# Patient Record
Sex: Male | Born: 1990 | Race: White | Hispanic: No | Marital: Single | Smoking: Current every day smoker
Health system: Southern US, Community
[De-identification: ages and names within clinical notes are randomized; demographics above are authoritative.]

## PROBLEM LIST (undated history)

## (undated) HISTORY — PX: CHOLECYSTECTOMY: SHX55

---
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
## 2012-06-26 ENCOUNTER — Emergency Department: Payer: Self-pay | Admitting: Internal Medicine

## 2012-06-26 LAB — CBC
HCT: 54.8 % — ABNORMAL HIGH (ref 40.0–52.0)
MCH: 30.1 pg (ref 26.0–34.0)
MCHC: 33.1 g/dL (ref 32.0–36.0)
MCV: 91 fL (ref 80–100)
Platelet: 289 10*3/uL (ref 150–440)
RDW: 13.3 % (ref 11.5–14.5)
WBC: 10.3 10*3/uL (ref 3.8–10.6)

## 2012-06-26 LAB — COMPREHENSIVE METABOLIC PANEL
Alkaline Phosphatase: 102 U/L (ref 50–136)
Anion Gap: 2 — ABNORMAL LOW (ref 7–16)
BUN: 9 mg/dL (ref 7–18)
Bilirubin,Total: 0.7 mg/dL (ref 0.2–1.0)
Calcium, Total: 9 mg/dL (ref 8.5–10.1)
Chloride: 106 mmol/L (ref 98–107)
Creatinine: 1.15 mg/dL (ref 0.60–1.30)
Glucose: 90 mg/dL (ref 65–99)
Osmolality: 278 (ref 275–301)
Potassium: 3.7 mmol/L (ref 3.5–5.1)
Sodium: 140 mmol/L (ref 136–145)
Total Protein: 7.9 g/dL (ref 6.4–8.2)

## 2012-09-18 ENCOUNTER — Emergency Department: Payer: Self-pay | Admitting: Emergency Medicine

## 2012-10-23 ENCOUNTER — Emergency Department: Payer: Self-pay | Admitting: Emergency Medicine

## 2012-10-23 LAB — CBC WITH DIFFERENTIAL/PLATELET
Basophil %: 1.1 %
Eosinophil #: 0.7 10*3/uL (ref 0.0–0.7)
Eosinophil %: 6.7 %
HCT: 51.1 % (ref 40.0–52.0)
Lymphocyte %: 25.5 %
MCH: 30.9 pg (ref 26.0–34.0)
MCHC: 34.4 g/dL (ref 32.0–36.0)
Monocyte #: 0.7 x10 3/mm (ref 0.2–1.0)
Monocyte %: 6.8 %
Neutrophil %: 59.9 %
Platelet: 249 10*3/uL (ref 150–440)
RBC: 5.68 10*6/uL (ref 4.40–5.90)
WBC: 10.6 10*3/uL (ref 3.8–10.6)

## 2012-10-23 LAB — COMPREHENSIVE METABOLIC PANEL
Albumin: 3.8 g/dL (ref 3.4–5.0)
Alkaline Phosphatase: 117 U/L (ref 50–136)
BUN: 14 mg/dL (ref 7–18)
Bilirubin,Total: 0.5 mg/dL (ref 0.2–1.0)
Chloride: 109 mmol/L — ABNORMAL HIGH (ref 98–107)
Co2: 31 mmol/L (ref 21–32)
EGFR (African American): >60
EGFR (Non-African Amer.): >60
Glucose: 91 mg/dL (ref 65–99)
Osmolality: 287 (ref 275–301)
SGPT (ALT): 96 U/L — ABNORMAL HIGH (ref 12–78)
Sodium: 144 mmol/L (ref 136–145)
Total Protein: 7.7 g/dL (ref 6.4–8.2)

## 2012-10-23 LAB — LIPASE, BLOOD: Lipase: 137 U/L (ref 73–393)

## 2012-11-17 ENCOUNTER — Ambulatory Visit: Payer: Self-pay | Admitting: Surgery

## 2012-11-18 ENCOUNTER — Ambulatory Visit: Payer: Self-pay | Admitting: Surgery

## 2013-04-03 ENCOUNTER — Emergency Department: Payer: Self-pay

## 2013-04-03 LAB — CBC
HCT: 49.6 % (ref 40.0–52.0)
HGB: 17.2 g/dL (ref 13.0–18.0)
MCHC: 34.6 g/dL (ref 32.0–36.0)
Platelet: 252 10*3/uL (ref 150–440)
RBC: 5.56 10*6/uL (ref 4.40–5.90)
RDW: 12.6 % (ref 11.5–14.5)
WBC: 10.9 10*3/uL — ABNORMAL HIGH (ref 3.8–10.6)

## 2013-04-03 LAB — BASIC METABOLIC PANEL
BUN: 9 mg/dL (ref 7–18)
Calcium, Total: 9 mg/dL (ref 8.5–10.1)
Creatinine: 0.99 mg/dL (ref 0.60–1.30)
EGFR (African American): >60
EGFR (Non-African Amer.): >60
Glucose: 86 mg/dL (ref 65–99)

## 2013-04-03 LAB — TROPONIN I: Troponin-I: <0.02 ng/mL

## 2013-04-05 ENCOUNTER — Emergency Department: Payer: Self-pay | Admitting: Emergency Medicine

## 2013-04-05 LAB — COMPREHENSIVE METABOLIC PANEL
Albumin: 3.6 g/dL (ref 3.4–5.0)
Alkaline Phosphatase: 82 U/L
Anion Gap: 4 — ABNORMAL LOW (ref 7–16)
Bilirubin,Total: 0.4 mg/dL (ref 0.2–1.0)
Calcium, Total: 9.1 mg/dL (ref 8.5–10.1)
Co2: 30 mmol/L (ref 21–32)
Creatinine: 1.07 mg/dL (ref 0.60–1.30)
EGFR (Non-African Amer.): >60
Glucose: 97 mg/dL (ref 65–99)
Osmolality: 279 (ref 275–301)
Potassium: 4.2 mmol/L (ref 3.5–5.1)
Total Protein: 7 g/dL (ref 6.4–8.2)

## 2013-04-05 LAB — CBC
HCT: 49.7 % (ref 40.0–52.0)
HGB: 17.1 g/dL (ref 13.0–18.0)
MCHC: 34.5 g/dL (ref 32.0–36.0)
MCV: 90 fL (ref 80–100)
RBC: 5.5 10*6/uL (ref 4.40–5.90)
RDW: 12.7 % (ref 11.5–14.5)
WBC: 10.4 10*3/uL (ref 3.8–10.6)

## 2013-04-05 LAB — URINALYSIS, COMPLETE
Bacteria: NONE SEEN
Bilirubin,UR: NEGATIVE
Glucose,UR: NEGATIVE mg/dL (ref 0–75)
Leukocyte Esterase: NEGATIVE
Nitrite: NEGATIVE
Ph: 7 (ref 4.5–8.0)
Protein: NEGATIVE
RBC,UR: 1 /HPF (ref 0–5)
Specific Gravity: 1.023 (ref 1.003–1.030)
WBC UR: <1 /HPF (ref 0–5)

## 2013-04-20 ENCOUNTER — Emergency Department: Payer: Self-pay | Admitting: Emergency Medicine

## 2013-04-20 LAB — BASIC METABOLIC PANEL
Anion Gap: 4 — ABNORMAL LOW (ref 7–16)
BUN: 9 mg/dL (ref 7–18)
Calcium, Total: 9 mg/dL (ref 8.5–10.1)
Chloride: 107 mmol/L (ref 98–107)
Creatinine: 0.93 mg/dL (ref 0.60–1.30)
EGFR (African American): >60
EGFR (Non-African Amer.): >60
Glucose: 95 mg/dL (ref 65–99)
Osmolality: 274 (ref 275–301)
Potassium: 3.7 mmol/L (ref 3.5–5.1)
Sodium: 138 mmol/L (ref 136–145)

## 2013-04-20 LAB — URINALYSIS, COMPLETE
Bacteria: NONE SEEN
Blood: NEGATIVE
Glucose,UR: NEGATIVE mg/dL (ref 0–75)
Nitrite: NEGATIVE
Ph: 7 (ref 4.5–8.0)
Protein: NEGATIVE
Squamous Epithelial: NONE SEEN

## 2013-04-20 LAB — CBC WITH DIFFERENTIAL/PLATELET
Basophil %: 1.2 %
Eosinophil #: 0.6 10*3/uL (ref 0.0–0.7)
Eosinophil %: 5.9 %
HCT: 51.1 % (ref 40.0–52.0)
HGB: 17.8 g/dL (ref 13.0–18.0)
Lymphocyte %: 25.4 %
MCH: 31.2 pg (ref 26.0–34.0)
MCHC: 34.8 g/dL (ref 32.0–36.0)
Monocyte %: 8.8 %
Neutrophil #: 5.6 10*3/uL (ref 1.4–6.5)
Neutrophil %: 58.7 %
Platelet: 244 10*3/uL (ref 150–440)
RBC: 5.7 10*6/uL (ref 4.40–5.90)
RDW: 12.8 % (ref 11.5–14.5)

## 2013-05-05 ENCOUNTER — Emergency Department: Payer: Self-pay | Admitting: Emergency Medicine

## 2013-05-08 LAB — BETA STREP CULTURE(ARMC)

## 2014-08-17 NOTE — Op Note (Signed)
PATIENT NAME:  Eric Copeland, Eric Copeland MR#:  [id number] DATE OF BIRTH:  Jul 06, 1990  DATE OF PROCEDURE:  11/18/2012  ATTENDING PHYSICIAN: Harrell Gave A. Rozetta Stumpp, MD  ASSISTANT: Princella Pellegrini, PA student.   PREOPERATIVE DIAGNOSIS: Symptomatic cholelithiasis.   POSTOPERATIVE DIAGNOSIS: Symptomatic cholelithiasis.  PROCEDURE PERFORMED: Laparoscopic cholecystectomy.   ANESTHESIA: General.   ESTIMATED BLOOD LOSS: 10 mL.   COMPLICATIONS: None.   SPECIMENS: Gallbladder.   INDICATION FOR SURGERY: Eric Copeland is a pleasant 24 year old male with recurrent epigastric right upper quadrant pain and was found to have gallstones and pain consistent with biliary disease. He is brought to the operating room suite for laparoscopic cholecystectomy.   DETAILS OF PROCEDURE: As follows: Informed consent was obtained. Eric Copeland was brought to the operating room suite. He was laid supine on the operating room table. He was induced. Endotracheal tube was placed. General anesthesia was administered. His abdomen was then prepped and draped in standard surgical fashion. A timeout was then performed, correctly identifying the patient name, operative site and procedure to be performed. A supraumbilical incision was made. This was deepened down to the fascia. The fascia was incised. The peritoneum was entered. Two stay sutures were placed through the fasciotomy. The Hasson trocar was placed into the abdomen, and the abdomen was insufflated. The gallbladder was visualized. It did appear to have adhesive omentum to it, but it did not appear particularly inflamed. An 11 mm epigastric port was placed. Two 5 mm subcostal ports at the right midclavicular and right anterior axillary line were placed. The gallbladder was then folded over the dome of the liver. The cystic artery and cystic duct were dissected out. These were clipped 3 times and ligated. The gallbladder was then taken off the gallbladder fossa using Bovie  electrocautery. It was then taken out through the umbilicus with an Endo Catch bag. The abdomen was then irrigated, and the gallbladder fossa was examined. There was no obvious bleeding. The ports were then taken out under direct visualization. The pneumoperitoneum was evacuated. The supraumbilical port site was closed with figure-of-eight 0 Vicryl. The skin was then closed with a 4-0 Monocryl deep dermal suture. Steri-Strips, Telfa gauze and Tegaderm were then used to complete the dressing. The patient was then awoken, extubated and brought to the postanesthesia care unit. There were no immediate complications. Needle, sponge and instrument counts were correct at the end of the procedure.   ____________________________ Glena Norfolk. Analys Ryden, MD cal:OSi D: 11/18/2012 11:13:53 ET T: 11/18/2012 11:39:34 ET JOB#: [id number]  cc: Harrell Gave A. Daiden Coltrane, MD, <Dictator> Floyde Parkins MD ELECTRONICALLY SIGNED 11/19/2012 11:34

## 2014-10-04 ENCOUNTER — Encounter: Payer: Self-pay | Admitting: Emergency Medicine

## 2014-10-04 ENCOUNTER — Emergency Department
Admission: EM | Admit: 2014-10-04 | Discharge: 2014-10-04 | Disposition: A | Discharge status: Home / Self Care | Payer: Self-pay | Attending: Emergency Medicine | Admitting: Emergency Medicine

## 2014-10-04 ENCOUNTER — Emergency Department: Payer: Self-pay

## 2014-10-04 DIAGNOSIS — Z720 Tobacco use: Secondary | ICD-10-CM | POA: Insufficient documentation

## 2014-10-04 DIAGNOSIS — W1839XA Other fall on same level, initial encounter: Secondary | ICD-10-CM | POA: Insufficient documentation

## 2014-10-04 DIAGNOSIS — S93402A Sprain of unspecified ligament of left ankle, initial encounter: Secondary | ICD-10-CM | POA: Insufficient documentation

## 2014-10-04 DIAGNOSIS — Y9389 Activity, other specified: Secondary | ICD-10-CM | POA: Insufficient documentation

## 2014-10-04 DIAGNOSIS — Y998 Other external cause status: Secondary | ICD-10-CM | POA: Insufficient documentation

## 2014-10-04 DIAGNOSIS — Y9289 Other specified places as the place of occurrence of the external cause: Secondary | ICD-10-CM | POA: Insufficient documentation

## 2014-10-04 MED ORDER — NAPROXEN 500 MG PO TABS: 500.0000 mg | ORAL_TABLET | Freq: Two times a day (BID) | ORAL | Status: DC

## 2014-10-04 MED ORDER — HYDROCODONE-ACETAMINOPHEN 5-325 MG PO TABS: 1.0000 | ORAL_TABLET | ORAL | Status: DC | PRN

## 2014-10-04 NOTE — ED Provider Notes (Signed)
[facility] Emergency Department Provider Note  ____________________________________________  Time seen: Approximately 3:29 PM  I have reviewed the triage vital signs and the nursing notes.   HISTORY  Chief Complaint Ankle Pain    HPI Eric Copeland is a 24 y.o. male who presents to the emergency department for left ankle pain. He states approximately 2 hours ago he fell out of his truck, which is high off the ground. He was wearing boots when the incident occurred. He reports immediate swelling and pain after falling. He denies previous injury to the ankle.  History reviewed. No pertinent past medical history.  There are no active problems to display for this patient.   History reviewed. No pertinent past surgical history.  Current Outpatient Rx  Name  Route  Sig  Dispense  Refill  . HYDROcodone-acetaminophen (NORCO/VICODIN) 5-325 MG per tablet   Oral   Take 1 tablet by mouth every 4 (four) hours as needed for moderate pain.   9 tablet   0   . naproxen (NAPROSYN) 500 MG tablet   Oral   Take 1 tablet (500 mg total) by mouth 2 (two) times daily with a meal.   60 tablet   2     Allergies Codeine  No family history on file.  Social History History  Substance Use Topics  . Smoking status: Current Every Day Smoker  . Smokeless tobacco: Not on file  . Alcohol Use: No    Review of Systems Constitutional: No recent illness. Eyes: No visual changes. ENT: No sore throat. Cardiovascular: Denies chest pain or palpitations. Respiratory: Denies shortness of breath. Gastrointestinal: No abdominal pain.  Genitourinary: Negative for dysuria. Musculoskeletal: Pain in left ankle Skin: Negative for rash. Neurological: Negative for headaches, focal weakness or numbness. 10-point ROS otherwise negative.  ____________________________________________   PHYSICAL EXAM:  VITAL SIGNS: ED Triage Vitals  Enc Vitals Group     BP 10/04/14 1454 139/78  mmHg     Pulse Rate 10/04/14 1454 90     Resp --      Temp 10/04/14 1454 98 F (36.7 C)     Temp Source 10/04/14 1454 Oral     SpO2 10/04/14 1454 99 %     Weight 10/04/14 1454 250 lb (113.399 kg)     Height 10/04/14 1454 5\' 9"  (1.753 m)     Head Cir --      Peak Flow --      Pain Score 10/04/14 1445 7     Pain Loc --      Pain Edu? --      Excl. in [place]? --     Constitutional: Alert and oriented. Well appearing and in no acute distress. Eyes: Conjunctivae are normal. EOMI. Head: Atraumatic. Nose: No congestion/rhinnorhea. Neck: No stridor. Cardiovascular: 2+DP pulses with normal capillary refill  Respiratory: Normal respiratory effort.   Musculoskeletal: Swelling and tenderness to lateral left ankle. Neurologic:  Normal speech and language. No gross focal neurologic deficits are appreciated. Speech is normal. No gait instability. Skin:  Skin is warm, dry and intact. Atraumatic. Psychiatric: Mood and affect are normal. Speech and behavior are normal.  ____________________________________________   LABS (all labs ordered are listed, but only abnormal results are displayed)  Labs Reviewed - No data to display ____________________________________________  RADIOLOGY  Negative for fracture. ____________________________________________   PROCEDURES  Procedure(s) performed: Ankle stirrup splint applied by ER tech. Neurovascularly intact post-splint application. Crutches also given.   ____________________________________________   INITIAL IMPRESSION /  ASSESSMENT AND PLAN / ED COURSE  Pertinent labs & imaging results that were available during my care of the patient were reviewed by me and considered in my medical decision making (see chart for details).  Patient was advised to follow-up with orthopedics for symptoms that are not improving over 7 days. He was advised to return to the emergency department for symptoms that change or worsen if he is unable schedule an  appointment. ____________________________________________   FINAL CLINICAL IMPRESSION(S) / ED DIAGNOSES  Final diagnoses:  Ankle sprain, left, initial encounter       Victorino Dike, FNP 10/04/14 1731  Lavonia Drafts, MD 10/06/14 [phone]

## 2014-10-04 NOTE — ED Notes (Signed)
Twisted left ankle   Ankle bruising and swollen

## 2014-10-04 NOTE — ED Notes (Addendum)
NAD noted at time of D/C. Pt denies questions or concerns. Pt taken to the lobby via wheelchair at this time.  

## 2014-10-04 NOTE — Discharge Instructions (Signed)

## 2014-10-05 MED ORDER — SULFAMETHOXAZOLE-TRIMETHOPRIM 200-40 MG/5ML PO SUSP
ORAL | Status: AC
  Filled 2014-10-05: qty 40

## 2015-03-11 ENCOUNTER — Emergency Department
Admission: EM | Admit: 2015-03-11 | Discharge: 2015-03-11 | Disposition: A | Discharge status: Home / Self Care | Payer: Self-pay | Attending: Emergency Medicine | Admitting: Emergency Medicine

## 2015-03-11 ENCOUNTER — Emergency Department: Payer: Self-pay

## 2015-03-11 ENCOUNTER — Encounter: Payer: Self-pay | Admitting: Emergency Medicine

## 2015-03-11 DIAGNOSIS — S161XXA Strain of muscle, fascia and tendon at neck level, initial encounter: Secondary | ICD-10-CM | POA: Insufficient documentation

## 2015-03-11 DIAGNOSIS — Y998 Other external cause status: Secondary | ICD-10-CM | POA: Insufficient documentation

## 2015-03-11 DIAGNOSIS — X58XXXA Exposure to other specified factors, initial encounter: Secondary | ICD-10-CM | POA: Insufficient documentation

## 2015-03-11 DIAGNOSIS — Y9389 Activity, other specified: Secondary | ICD-10-CM | POA: Insufficient documentation

## 2015-03-11 DIAGNOSIS — Y9289 Other specified places as the place of occurrence of the external cause: Secondary | ICD-10-CM | POA: Insufficient documentation

## 2015-03-11 DIAGNOSIS — F17200 Nicotine dependence, unspecified, uncomplicated: Secondary | ICD-10-CM | POA: Insufficient documentation

## 2015-03-11 MED ORDER — CYCLOBENZAPRINE HCL 10 MG PO TABS: 10.0000 mg | ORAL_TABLET | Freq: Three times a day (TID) | ORAL | Status: DC | PRN

## 2015-03-11 MED ORDER — IBUPROFEN 800 MG PO TABS: 800.0000 mg | ORAL_TABLET | Freq: Three times a day (TID) | ORAL | Status: DC | PRN

## 2015-03-11 NOTE — ED Notes (Signed)
Pt to ed with c/o swelling to back of head x 3-4 days.  Denies injury.

## 2015-03-11 NOTE — Discharge Instructions (Signed)
Cervical Sprain  A cervical sprain is an injury in the neck in which the strong, fibrous tissues (ligaments) that connect your neck bones stretch or tear. Cervical sprains can range from mild to severe. Severe cervical sprains can cause the neck vertebrae to be unstable. This can lead to damage of the spinal cord and can result in serious nervous system problems. The amount of time it takes for a cervical sprain to get better depends on the cause and extent of the injury. Most cervical sprains heal in 1 to 3 weeks.  CAUSES   Severe cervical sprains may be caused by:    Contact sport injuries (such as from football, rugby, wrestling, hockey, auto racing, gymnastics, diving, martial arts, or boxing).    Motor vehicle collisions.    Whiplash injuries. This is an injury from a sudden forward and backward whipping movement of the head and neck.   Falls.   Mild cervical sprains may be caused by:    Being in an awkward position, such as while cradling a telephone between your ear and shoulder.    Sitting in a chair that does not offer proper support.    Working at a poorly designed computer station.    Looking up or down for long periods of time.   SYMPTOMS    Pain, soreness, stiffness, or a burning sensation in the front, back, or sides of the neck. This discomfort may develop immediately after the injury or slowly, 24 hours or more after the injury.    Pain or tenderness directly in the middle of the back of the neck.    Shoulder or upper back pain.    Limited ability to move the neck.    Headache.    Dizziness.    Weakness, numbness, or tingling in the hands or arms.    Muscle spasms.    Difficulty swallowing or chewing.    Tenderness and swelling of the neck.   DIAGNOSIS   Most of the time your health care provider can diagnose a cervical sprain by taking your history and doing a physical exam. Your health care provider will ask about previous neck injuries and any known neck  problems, such as arthritis in the neck. X-rays may be taken to find out if there are any other problems, such as with the bones of the neck. Other tests, such as a CT scan or MRI, may also be needed.   TREATMENT   Treatment depends on the severity of the cervical sprain. Mild sprains can be treated with rest, keeping the neck in place (immobilization), and pain medicines. Severe cervical sprains are immediately immobilized. Further treatment is done to help with pain, muscle spasms, and other symptoms and may include:   Medicines, such as pain relievers, numbing medicines, or muscle relaxants.    Physical therapy. This may involve stretching exercises, strengthening exercises, and posture training. Exercises and improved posture can help stabilize the neck, strengthen muscles, and help stop symptoms from returning.   HOME CARE INSTRUCTIONS    Put ice on the injured area.     Put ice in a plastic bag.     Place a towel between your skin and the bag.     Leave the ice on for 15-20 minutes, 3-4 times a day.    If your injury was severe, you may have been given a cervical collar to wear. A cervical collar is a two-piece collar designed to keep your neck from moving while it heals.      Do not remove the collar unless instructed by your health care provider.    If you have long hair, keep it outside of the collar.    Ask your health care provider before making any adjustments to your collar. Minor adjustments may be required over time to improve comfort and reduce pressure on your chin or on the back of your head.    Ifyou are allowed to remove the collar for cleaning or bathing, follow your health care provider's instructions on how to do so safely.    Keep your collar clean by wiping it with mild soap and water and drying it completely. If the collar you have been given includes removable pads, remove them every 1-2 days and hand wash them with soap and water. Allow them to air dry. They should be completely  dry before you wear them in the collar.    If you are allowed to remove the collar for cleaning and bathing, wash and dry the skin of your neck. Check your skin for irritation or sores. If you see any, tell your health care provider.    Do not drive while wearing the collar.    Only take over-the-counter or prescription medicines for pain, discomfort, or fever as directed by your health care provider.    Keep all follow-up appointments as directed by your health care provider.    Keep all physical therapy appointments as directed by your health care provider.    Make any needed adjustments to your workstation to promote good posture.    Avoid positions and activities that make your symptoms worse.    Warm up and stretch before being active to help prevent problems.   SEEK MEDICAL CARE IF:    Your pain is not controlled with medicine.    You are unable to decrease your pain medicine over time as planned.    Your activity level is not improving as expected.   SEEK IMMEDIATE MEDICAL CARE IF:    You develop any bleeding.   You develop stomach upset.   You have signs of an allergic reaction to your medicine.    Your symptoms get worse.    You develop new, unexplained symptoms.    You have numbness, tingling, weakness, or paralysis in any part of your body.   MAKE SURE YOU:    Understand these instructions.   Will watch your condition.   Will get help right away if you are not doing well or get worse.     This information is not intended to replace advice given to you by your health care provider. Make sure you discuss any questions you have with your health care provider.     Document Released: 02/08/2007 Document Revised: 04/18/2013 Document Reviewed: 10/19/2012  Elsevier Interactive Patient Education 2016 [employer]

## 2015-03-11 NOTE — ED Provider Notes (Signed)
[facility] Emergency Department Provider Note  ____________________________________________  Time seen: Approximately 1:57 PM  I have reviewed the triage vital signs and the nursing notes.   HISTORY  Chief Complaint Headache    HPI Eric Copeland is a 24 y.o. male presents for evaluation of swelling and mass in the back of his head for 3-4 days. Denies any known injury. States that he has a headache with occasional blurred vision.   History reviewed. No pertinent past medical history.  There are no active problems to display for this patient.   History reviewed. No pertinent past surgical history.  Current Outpatient Rx  Name  Route  Sig  Dispense  Refill  . cyclobenzaprine (FLEXERIL) 10 MG tablet   Oral   Take 1 tablet (10 mg total) by mouth every 8 (eight) hours as needed for muscle spasms.   30 tablet   1   . HYDROcodone-acetaminophen (NORCO/VICODIN) 5-325 MG per tablet   Oral   Take 1 tablet by mouth every 4 (four) hours as needed for moderate pain.   9 tablet   0   . ibuprofen (ADVIL,MOTRIN) 800 MG tablet   Oral   Take 1 tablet (800 mg total) by mouth every 8 (eight) hours as needed.   30 tablet   0     Allergies Codeine  History reviewed. No pertinent family history.  Social History Social History  Substance Use Topics  . Smoking status: Current Every Day Smoker  . Smokeless tobacco: None  . Alcohol Use: No    Review of Systems Constitutional: No fever/chills Eyes: Occasional vision changes ENT: No sore throat. As for headache positive for swelling posterior occipital cervical region Cardiovascular: Denies chest pain. Respiratory: Denies shortness of breath. Gastrointestinal: No abdominal pain.  No nausea, no vomiting.  No diarrhea.  No constipation. Genitourinary: Negative for dysuria. Musculoskeletal: Negative for back pain. Skin: Negative for rash. Neurological: Negative for headaches, focal weakness or  numbness.  10-point ROS otherwise negative.  ____________________________________________   PHYSICAL EXAM:  VITAL SIGNS: ED Triage Vitals  Enc Vitals Group     BP 03/11/15 1318 140/90 mmHg     Pulse Rate 03/11/15 1318 73     Resp 03/11/15 1318 20     Temp 03/11/15 1318 98.1 F (36.7 C)     Temp Source 03/11/15 1318 Oral     SpO2 03/11/15 1318 98 %     Weight 03/11/15 1318 220 lb (99.791 kg)     Height 03/11/15 1318 5\' 8"  (1.727 m)     Head Cir --      Peak Flow --      Pain Score 03/11/15 1318 8     Pain Loc --      Pain Edu? --      Excl. in [place]? --     Constitutional: Alert and oriented. Well appearing and in no acute distress. Eyes: Conjunctivae are normal. PERRL. EOMI. Head: Atraumatic. Nose: No congestion/rhinnorhea. Mouth/Throat: Mucous membranes are moist.  Oropharynx non-erythematous. Neck: No stridor.  Positive cervical spinal tenderness to palpation with limited range of motion to the lateralization and flexion Hematological/Lymphatic/Immunilogical: No cervical lymphadenopathy. Cardiovascular: Normal rate, regular rhythm. Grossly normal heart sounds.  Good peripheral circulation. Respiratory: Normal respiratory effort.  No retractions. Lungs CTAB. Gastrointestinal: Soft and nontender. No distention. No abdominal bruits. No CVA tenderness. Musculoskeletal: No lower extremity tenderness nor edema.  No joint effusions. Neurologic:  Normal speech and language. No gross focal neurologic deficits are  appreciated. No gait instability. Skin:  Skin is warm, dry and intact. No rash noted. Psychiatric: Mood and affect are normal. Speech and behavior are normal.  ____________________________________________   LABS (all labs ordered are listed, but only abnormal results are displayed)  Labs Reviewed - No data to display ____________________________________________   RADIOLOGY  No acute findings on cervical or head  CT. ____________________________________________   PROCEDURES  Procedure(s) performed: None  Critical Care performed: No  ____________________________________________   INITIAL IMPRESSION / ASSESSMENT AND PLAN / ED COURSE  Pertinent labs & imaging results that were available during my care of the patient were reviewed by me and considered in my medical decision making (see chart for details).  Cervical muscle strain. Rx given for Flexeril 10 mg 3 times a day, Motrin 800 mg 3 times a day. Patient follow-up with PCP or return to the ER with any worsening symptomology. ____________________________________________   FINAL CLINICAL IMPRESSION(S) / ED DIAGNOSES  Final diagnoses:  Cervical strain, acute, initial encounter      Arlyss Repress, PA-C 03/11/15 1500  Lavonia Drafts, MD 03/11/15 1511

## 2016-11-16 ENCOUNTER — Encounter: Payer: Self-pay | Admitting: Emergency Medicine

## 2016-11-16 ENCOUNTER — Emergency Department
Admission: EM | Admit: 2016-11-16 | Discharge: 2016-11-16 | Disposition: A | Discharge status: Home / Self Care | Payer: Self-pay | Attending: Emergency Medicine | Admitting: Emergency Medicine

## 2016-11-16 DIAGNOSIS — F17200 Nicotine dependence, unspecified, uncomplicated: Secondary | ICD-10-CM | POA: Insufficient documentation

## 2016-11-16 DIAGNOSIS — K047 Periapical abscess without sinus: Secondary | ICD-10-CM | POA: Insufficient documentation

## 2016-11-16 MED ORDER — AMOXICILLIN 500 MG PO CAPS: 500.0000 mg | ORAL_CAPSULE | Freq: Three times a day (TID) | ORAL | 0 refills | Status: DC

## 2016-11-16 MED ORDER — LIDOCAINE VISCOUS 2 % MT SOLN
10.0000 mL | OROMUCOSAL | 0 refills | Status: DC | PRN
Start: 2016-11-16 — End: 2016-12-08

## 2016-11-16 MED ORDER — IBUPROFEN 600 MG PO TABS: 600.0000 mg | ORAL_TABLET | Freq: Four times a day (QID) | ORAL | 0 refills | Status: DC | PRN

## 2016-11-16 NOTE — ED Notes (Signed)
See triage note presents with pain to left upper gumline for couple of days

## 2016-11-16 NOTE — ED Provider Notes (Signed)
[facility] Emergency Department Provider Note  ____________________________________________  Time seen: Approximately 8:39 AM  I have reviewed the triage vital signs and the nursing notes.   HISTORY  Chief Complaint Dental Pain    HPI Eric Copeland is a 26 y.o. male who presents emergency department with left upper tooth pain for 2 days. Pain worsened significantly this morning. He has had all of his wisdom teeth removed, the last one being 3 years ago. Last time he saw a dentist was 3 years ago.He tried to use a toothbrush and toothpaste to ease the pain, which helped yesterday but not today. He also tried to use a straw to loosen the tooth. He has an appointment with the dentist on Friday and is hoping to get it pulled. He denies fever, difficulty speaking, difficulty opening or closing mouth, drainage from mouth, shortness of breath, chest pain, nausea, vomiting, abdominal pain.   History reviewed. No pertinent past medical history.  There are no active problems to display for this patient.   History reviewed. No pertinent surgical history.  Prior to Admission medications   Medication Sig Start Date End Date Taking? Authorizing Provider  amoxicillin (AMOXIL) 500 MG capsule Take 1 capsule (500 mg total) by mouth 3 (three) times daily. 11/16/16   Laban Emperor, PA-C  cyclobenzaprine (FLEXERIL) 10 MG tablet Take 1 tablet (10 mg total) by mouth every 8 (eight) hours as needed for muscle spasms. 03/11/15   Beers, Pierce Crane, PA-C  HYDROcodone-acetaminophen (NORCO/VICODIN) 5-325 MG per tablet Take 1 tablet by mouth every 4 (four) hours as needed for moderate pain. 10/04/14   Triplett, Cari B, FNP  ibuprofen (ADVIL,MOTRIN) 600 MG tablet Take 1 tablet (600 mg total) by mouth every 6 (six) hours as needed. 11/16/16   Laban Emperor, PA-C  lidocaine (XYLOCAINE) 2 % solution Use as directed 10 mLs in the mouth or throat as needed for mouth pain. 11/16/16   Laban Emperor,  PA-C    Allergies Codeine  No family history on file.  Social History Social History  Substance Use Topics  . Smoking status: Current Every Day Smoker  . Smokeless tobacco: Not on file  . Alcohol use No     Review of Systems  Constitutional: No fever/chills Cardiovascular: No chest pain. Respiratory: No SOB. Gastrointestinal: No abdominal pain.  No nausea, no vomiting.  Skin: Negative for rash, abrasions, lacerations, ecchymosis. Neurological: Negative for headaches   ____________________________________________   PHYSICAL EXAM:  VITAL SIGNS: ED Triage Vitals  Enc Vitals Group     BP 11/16/16 0758 (!) 142/93     Pulse Rate 11/16/16 0758 60     Resp 11/16/16 0758 16     Temp 11/16/16 0758 98.1 F (36.7 C)     Temp Source 11/16/16 0758 Oral     SpO2 11/16/16 0758 100 %     Weight 11/16/16 0802 180 lb (81.6 kg)     Height 11/16/16 0802 5\' 9"  (1.753 m)     Head Circumference --      Peak Flow --      Pain Score 11/16/16 0757 10     Pain Loc --      Pain Edu? --      Excl. in [place]? --      Constitutional: Alert and oriented. Well appearing and in no acute distress. Eyes: Conjunctivae are normal. PERRL. EOMI. Head: Atraumatic. ENT:      Ears:      Nose: No congestion/rhinnorhea.  Mouth/Throat: Mucous membranes are moist. Large cavity in upper left back molar with mild swelling to left cheek. No drainage. No difficulty speaking or opening or closing mouth. Neck: No stridor. No cervical lymphadenopathy. Cardiovascular: Normal rate, regular rhythm.  Good peripheral circulation. Respiratory: Normal respiratory effort without tachypnea or retractions. Lungs CTAB. Good air entry to the bases with no decreased or absent breath sounds. Musculoskeletal: Full range of motion to all extremities. No gross deformities appreciated. Neurologic:  Normal speech and language. No gross focal neurologic deficits are appreciated.  Skin:  Skin is warm, dry and intact. No rash  noted.   ____________________________________________   LABS (all labs ordered are listed, but only abnormal results are displayed)  Labs Reviewed - No data to display ____________________________________________  EKG   ____________________________________________  RADIOLOGY  No results found.  ____________________________________________    PROCEDURES  Procedure(s) performed:    Procedures    Medications - No data to display   ____________________________________________   INITIAL IMPRESSION / ASSESSMENT AND PLAN / ED COURSE  Pertinent labs & imaging results that were available during my care of the patient were reviewed by me and considered in my medical decision making (see chart for details).  Review of the [place] CSRS was performed in accordance of the [place] prior to dispensing any controlled drugs.   Patient's diagnosis is consistent with dental infection. Vital signs and exam are reassuring. Patient has a follow-up appointment with the dentist on Friday. Patient will be discharged home with prescriptions for amoxicillin, Viscous Lidocaine, ibuprofen. Patient is given ED precautions to return to the ED for any worsening or new symptoms.     ____________________________________________  FINAL CLINICAL IMPRESSION(S) / ED DIAGNOSES  Final diagnoses:  Dental infection      NEW MEDICATIONS STARTED DURING THIS VISIT:  Discharge Medication List as of 11/16/2016  9:00 AM    START taking these medications   Details  amoxicillin (AMOXIL) 500 MG capsule Take 1 capsule (500 mg total) by mouth 3 (three) times daily., Starting Mon 11/16/2016, Print    lidocaine (XYLOCAINE) 2 % solution Use as directed 10 mLs in the mouth or throat as needed for mouth pain., Starting Mon 11/16/2016, Print            This chart was dictated using voice recognition software/Dragon. Despite best efforts to proofread, errors can occur which can change the meaning. Any change  was purely unintentional.    Laban Emperor, PA-C 11/16/16 0915    Nena Polio, MD 11/16/16 [phone]

## 2016-11-16 NOTE — ED Triage Notes (Signed)
Complaining of pain left upper wisdom tooth off and on for 2 days, constant now 10/10.  Patient states he has had wisdom teeth removed.

## 2016-11-16 NOTE — Discharge Instructions (Signed)
OPTIONS FOR DENTAL FOLLOW UP CARE ° °[place] Department of Health and Human Services - Local Safety Net Dental Clinics °http://www.ncdhhs.gov/dph/oralhealth/services/safetynetclinics.htm °  °[facility] (336-562-3123) ° °[facility] ([phone]) ° °[facility] (919-663-1744 ext [phone]) ° °[facility] ([phone]) ° °[facility] ([phone]) °This clinic caters to the indigent population and is on a lottery system. °Location: °[facility], [address], [place] °Clinic Hours: °Wednesdays from 6pm - 9pm, patients seen by a lottery system. °For dates, call or go to [email or web address] °Services: °Cleanings, fillings and simple extractions. °Payment Options: °DENTAL WORK IS FREE OF CHARGE. Bring proof of income or support. °Best way to get seen: °Arrive at 5:15 pm - this is a lottery, NOT first come/first serve, so arriving earlier will not increase your chances of being seen. °  °  °[facility] °[phone] °Select option 1 for emergencies °  °Location: °[facility], [address], [place] °Clinic Hours: °No walk-ins accepted - call the day before to schedule an appointment. °Check in times are 9:30 am and 1:30 pm. °Services: °Simple extractions, temporary fillings, pulpectomy/pulp debridement, uncomplicated abscess drainage. °Payment Options: °PAYMENT IS DUE AT THE TIME OF SERVICE.  Fee is usually $100-200, additional surgical procedures (e.g. abscess drainage) may be extra. °Cash, checks, Visa/MasterCard accepted.  Can file Medicaid if patient is covered for dental - patient should call case worker to check. °No discount for [facility] patients. °Best way to get seen: °MUST call the day before and get onto the schedule. Can usually be seen the next 1-2 days. No walk-ins accepted. °  °  °[facility] °[phone] °   °Location: °[facility], [address], [place] °Clinic Hours: °M, W, Th, F 8am or 1:30pm, Tues 9a or 1:30 - first come/first served. °Services: °Simple extractions, temporary fillings, uncomplicated abscess drainage.  You do not need to be an [place] resident. °Payment Options: °PAYMENT IS DUE AT THE TIME OF SERVICE. °Dental insurance, otherwise sliding scale - bring proof of income or support. °Depending on income and treatment needed, cost is usually $50-200. °Best way to get seen: °Arrive early as it is first come/first served. °  °  °[facility] °[phone] °  °Location: °[address] °Clinic Hours: °Mon-Thu 8a-5p °Services: °Most basic dental services including extractions and fillings. °Payment Options: °PAYMENT IS DUE AT THE TIME OF SERVICE. °Sliding scale, up to 50% off - bring proof if income or support. °Medicaid with dental option accepted. °Best way to get seen: °Call to schedule an appointment, can usually be seen within 2 weeks OR they will try to see walk-ins - show up at 8a or 2p (you may have to wait). °  °  °[facility] °919-245-2435 °[place] RESIDENTS ONLY °  °Location: °[facility], [address], [place], [place] [address] °Clinic Hours: By appointment only. °Monday - Thursday 8am-5pm, Friday 8am-12pm °Services: Cleanings, fillings, extractions. °Payment Options: °PAYMENT IS DUE AT THE TIME OF SERVICE. °Cash, Visa or MasterCard. Sliding scale - $30 minimum per service. °Best way to get seen: °Come in to office, complete packet and make an appointment - need proof of income °or support monies for each household member and proof of [place] residence. °Usually takes about a month to get in. °  °  °[facility] °[phone] °  °Location: °[address]   [place] °Clinic Hours: Walk-in Urgent Care Dental Services are offered Monday-Friday  mornings only. °The numbers of emergencies accepted daily is limited to the number of °providers available. °Maximum 15 - Mondays, Wednesdays & Thursdays °Maximum 10 - Tuesdays & Fridays °Services: °You do not need to be a [place] resident to be seen for a dental emergency. °Emergencies are defined as pain, swelling, abnormal bleeding, or dental trauma. Walkins will receive x-rays if needed. °NOTE: Dental cleaning is not an emergency. °Payment Options: °PAYMENT IS DUE AT THE TIME OF SERVICE. °Minimum co-pay is $40.00 for uninsured patients. °Minimum co-pay is $3.00 for Medicaid with dental coverage. °Dental Insurance is accepted and must be presented at time of visit. °Medicare does not cover dental. °Forms of payment: Cash, credit card, checks. °Best way to get seen: °If not previously registered with the clinic, walk-in dental registration begins at 7:15 am and is on a first come/first serve basis. °If previously registered with the clinic, call to make an appointment. °  °  °[facility] °[phone] °[place] RESIDENTS ONLY °  °Location: °[address], [place], [place] °Clinic Hours: °Mon-Thu 10a-2p °Services: Extractions only! °Payment Options: °FREE (donations accepted) - bring proof of income or support °Best way to get seen: °Call and schedule an appointment OR come at 8am on the 1st Monday of every month (except for holidays) when it is first come/first served. °  °  °[facility] °[phone] °  °Location: °[address], [place] °Clinic Hours: °Friday mornings °Services, Payment Options, Best way to get seen: °Call for info °

## 2016-12-08 ENCOUNTER — Encounter: Payer: Self-pay | Admitting: Emergency Medicine

## 2016-12-08 ENCOUNTER — Emergency Department
Admission: EM | Admit: 2016-12-08 | Discharge: 2016-12-08 | Disposition: A | Discharge status: Home / Self Care | Payer: Self-pay | Attending: Emergency Medicine | Admitting: Emergency Medicine

## 2016-12-08 DIAGNOSIS — K0889 Other specified disorders of teeth and supporting structures: Secondary | ICD-10-CM | POA: Insufficient documentation

## 2016-12-08 DIAGNOSIS — F17200 Nicotine dependence, unspecified, uncomplicated: Secondary | ICD-10-CM | POA: Insufficient documentation

## 2016-12-08 MED ORDER — AMOXICILLIN 500 MG PO CAPS: 500.0000 mg | ORAL_CAPSULE | Freq: Three times a day (TID) | ORAL | 0 refills | Status: DC

## 2016-12-08 MED ORDER — IBUPROFEN 600 MG PO TABS: 600.0000 mg | ORAL_TABLET | Freq: Three times a day (TID) | ORAL | 0 refills | Status: DC | PRN

## 2016-12-08 NOTE — ED Triage Notes (Signed)
Dental pain, x1 month, increased pain the last 2 days , has been here for same approx 3 weeks ago has not seen follow up dentist due to financial restraints.

## 2016-12-08 NOTE — ED Notes (Signed)
Pt says broke off upper left tooth about a month ago.  Seen here and took meds.  rean out and now has pain again.  Says he was  Not referred to low priced dentists.  Says he called dentists and they needed 250 dollars up front.

## 2016-12-08 NOTE — Discharge Instructions (Signed)
OPTIONS FOR DENTAL FOLLOW UP CARE ° °[place] Department of Health and Human Services - Local Safety Net Dental Clinics °http://www.ncdhhs.gov/dph/oralhealth/services/safetynetclinics.htm °  °[facility] ([phone]) ° °[facility] ([phone]) ° °[facility] ([phone] ext 237) ° °[facility] ([phone]) ° °[facility] ([phone]) °This clinic caters to the indigent population and is on a lottery system. °Location: °[facility], [address], [place] °Clinic Hours: °Wednesdays from 6pm - 9pm, patients seen by a lottery system. °For dates, call or go to [email or web address] °Services: °Cleanings, fillings and simple extractions. °Payment Options: °DENTAL WORK IS FREE OF CHARGE. Bring proof of income or support. °Best way to get seen: °Arrive at 5:15 pm - this is a lottery, NOT first come/first serve, so arriving earlier will not increase your chances of being seen. °  °  °[facility] °[phone] °Select option 1 for emergencies °  °Location: °[facility], [address], [place] °Clinic Hours: °No walk-ins accepted - call the day before to schedule an appointment. °Check in times are 9:30 am and 1:30 pm. °Services: °Simple extractions, temporary fillings, pulpectomy/pulp debridement, uncomplicated abscess drainage. °Payment Options: °PAYMENT IS DUE AT THE TIME OF SERVICE.  Fee is usually $100-200, additional surgical procedures (e.g. abscess drainage) may be extra. °Cash, checks, Visa/MasterCard accepted.  Can file Medicaid if patient is covered for dental - patient should call case worker to check. °No discount for [facility] patients. °Best way to get seen: °MUST call the day before and get onto the schedule. Can usually be seen the next 1-2 days. No walk-ins accepted. °  °  °[facility] °[phone] °   °Location: °[facility], [address], [place] °Clinic Hours: °M, W, Th, F 8am or 1:30pm, Tues 9a or 1:30 - first come/first served. °Services: °Simple extractions, temporary fillings, uncomplicated abscess drainage.  You do not need to be an [place] resident. °Payment Options: °PAYMENT IS DUE AT THE TIME OF SERVICE. °Dental insurance, otherwise sliding scale - bring proof of income or support. °Depending on income and treatment needed, cost is usually $50-200. °Best way to get seen: °Arrive early as it is first come/first served. °  °  °[facility] °[phone] °  °Location: °[address] °Clinic Hours: °Mon-Thu 8a-5p °Services: °Most basic dental services including extractions and fillings. °Payment Options: °PAYMENT IS DUE AT THE TIME OF SERVICE. °Sliding scale, up to 50% off - bring proof if income or support. °Medicaid with dental option accepted. °Best way to get seen: °Call to schedule an appointment, can usually be seen within 2 weeks OR they will try to see walk-ins - show up at 8a or 2p (you may have to wait). °  °  °[facility] °[phone] °[place] RESIDENTS ONLY °  °Location: °[facility], [address], [place], [place] [address] °Clinic Hours: By appointment only. °Monday - Thursday 8am-5pm, Friday 8am-12pm °Services: Cleanings, fillings, extractions. °Payment Options: °PAYMENT IS DUE AT THE TIME OF SERVICE. °Cash, Visa or MasterCard. Sliding scale - $30 minimum per service. °Best way to get seen: °Come in to office, complete packet and make an appointment - need proof of income °or support monies for each household member and proof of [place] residence. °Usually takes about a month to get in. °  °  °[facility] °[phone] °  °Location: °[address]   [place] °Clinic Hours: Walk-in Urgent Care Dental Services are offered Monday-Friday  mornings only. °The numbers of emergencies accepted daily is limited to the number of °providers available. °Maximum 15 - Mondays, Wednesdays & Thursdays °Maximum 10 - Tuesdays & Fridays °Services: °You do not need to be a [place] resident to be seen for a dental emergency. °Emergencies are defined as pain, swelling, abnormal bleeding, or dental trauma. Walkins will receive x-rays if needed. °NOTE: Dental cleaning is not an emergency. °Payment Options: °PAYMENT IS DUE AT THE TIME OF SERVICE. °Minimum co-pay is $40.00 for uninsured patients. °Minimum co-pay is $3.00 for Medicaid with dental coverage. °Dental Insurance is accepted and must be presented at time of visit. °Medicare does not cover dental. °Forms of payment: Cash, credit card, checks. °Best way to get seen: °If not previously registered with the clinic, walk-in dental registration begins at 7:15 am and is on a first come/first serve basis. °If previously registered with the clinic, call to make an appointment. °  °  °[facility] °[phone] °[place] RESIDENTS ONLY °  °Location: °[address], [place], [place] °Clinic Hours: °Mon-Thu 10a-2p °Services: Extractions only! °Payment Options: °FREE (donations accepted) - bring proof of income or support °Best way to get seen: °Call and schedule an appointment OR come at 8am on the 1st Monday of every month (except for holidays) when it is first come/first served. °  °  °[facility] °[phone] °  °Location: °[address], [place] °Clinic Hours: °Friday mornings °Services, Payment Options, Best way to get seen: °Call for info °

## 2016-12-08 NOTE — ED Provider Notes (Signed)
[facility] Emergency Department Provider Note  ____________________________________________   First MD Initiated Contact with Patient 12/08/16 [phone]     (approximate)  I have reviewed the triage vital signs and the nursing notes.   HISTORY  Chief Complaint Dental Pain   HPI [name] is a 26 y.o. male is here with complaint of dental pain for 1 month. Patient states pain is increased in the last 2 days. He was seen in the emergency room approximate 3 weeks ago at which time he states that he "lost his dental list". He is here today because he ran out of antibiotics. He rates his pain as an 8 out of 10. Patient continues to smoke despite his dental pain.   History reviewed. No pertinent past medical history.  There are no active problems to display for this patient.   Past Surgical History:  Procedure Laterality Date  . CHOLECYSTECTOMY      Prior to Admission medications   Medication Sig Start Date End Date Taking? Authorizing Provider  amoxicillin (AMOXIL) 500 MG capsule Take 1 capsule (500 mg total) by mouth 3 (three) times daily. 12/08/16   Johnn Hai, PA-C  ibuprofen (ADVIL,MOTRIN) 600 MG tablet Take 1 tablet (600 mg total) by mouth every 8 (eight) hours as needed. 12/08/16   Johnn Hai, PA-C    Allergies Codeine  No family history on file.  Social History Social History  Substance Use Topics  . Smoking status: Current Every Day Smoker  . Smokeless tobacco: Never Used  . Alcohol use No    Review of Systems Constitutional: No fever/chills Eyes: No visual changes. ENT: Positive dental pain. Cardiovascular: Denies chest pain. Respiratory: Denies shortness of breath.  ____________________________________________   PHYSICAL EXAM:  VITAL SIGNS: ED Triage Vitals  Enc Vitals Group     BP 12/08/16 0759 128/76     Pulse Rate 12/08/16 0759 92     Resp 12/08/16 0759 18     Temp 12/08/16 0759 97.8 F (36.6 C)   Temp Source 12/08/16 0759 Oral     SpO2 12/08/16 0759 100 %     Weight 12/08/16 0800 180 lb (81.6 kg)     Height 12/08/16 0800 5\' 9"  (1.753 m)     Head Circumference --      Peak Flow --      Pain Score 12/08/16 0759 8     Pain Loc --      Pain Edu? --      Excl. in [place]? --     Constitutional: Alert and oriented. Well appearing and in no acute distress. Eyes: Conjunctivae are normal. PERRL. EOMI. Head: Atraumatic. Nose: No congestion/rhinnorhea. Mouth/Throat: Mucous membranes are moist.  Oropharynx non-erythematous.Left upper posterior molar in poor repair. Minimal gum edema. Tender to palpation with a tongue depressor. No obvious abscess formation on the gum or drainage. Neck: No stridor.   Hematological/Lymphatic/Immunilogical: No cervical lymphadenopathy. Cardiovascular: Normal rate, regular rhythm. Grossly normal heart sounds.  Good peripheral circulation. Respiratory: Normal respiratory effort.  No retractions. Lungs CTAB. Musculoskeletal: Moves upper and lower extremities without any difficulty. Normal gait was noted. Neurologic:  Normal speech and language. No gross focal neurologic deficits are appreciated. No gait instability. Skin:  Skin is warm, dry and intact. No rash noted. Psychiatric: Mood and affect are normal. Speech and behavior are normal.  ____________________________________________   LABS (all labs ordered are listed, but only abnormal results are displayed)  Labs Reviewed - No data to display  PROCEDURES  Procedure(s) performed: None  Procedures  Critical Care performed: No  ____________________________________________   INITIAL IMPRESSION / ASSESSMENT AND PLAN / ED COURSE  Pertinent labs & imaging results that were available during my care of the patient were reviewed by me and considered in my medical decision making (see chart for details).  Patient was given a prescription for amoxicillin 500 mg 3 times a day and ibuprofen. He was given a  list of dental clinics with a specific list including the dental clinics that have walk and hours. He was strongly encouraged to take advantage of this clinic.   ___________________________________________   FINAL CLINICAL IMPRESSION(S) / ED DIAGNOSES  Final diagnoses:  Pain, dental      NEW MEDICATIONS STARTED DURING THIS VISIT:  New Prescriptions   AMOXICILLIN (AMOXIL) 500 MG CAPSULE    Take 1 capsule (500 mg total) by mouth 3 (three) times daily.   IBUPROFEN (ADVIL,MOTRIN) 600 MG TABLET    Take 1 tablet (600 mg total) by mouth every 8 (eight) hours as needed.     Note:  This document was prepared using Dragon voice recognition software and may include unintentional dictation errors.    Johnn Hai, PA-C 12/08/16 6789    Drenda Freeze, MD 12/10/16 [phone]

## 2017-04-23 ENCOUNTER — Encounter: Payer: Self-pay | Admitting: Emergency Medicine

## 2017-04-23 ENCOUNTER — Other Ambulatory Visit: Payer: Self-pay

## 2017-04-23 ENCOUNTER — Emergency Department
Admission: EM | Admit: 2017-04-23 | Discharge: 2017-04-23 | Disposition: A | Discharge status: Home / Self Care | Payer: Self-pay | Attending: Emergency Medicine | Admitting: Emergency Medicine

## 2017-04-23 DIAGNOSIS — L235 Allergic contact dermatitis due to other chemical products: Secondary | ICD-10-CM | POA: Insufficient documentation

## 2017-04-23 DIAGNOSIS — F17200 Nicotine dependence, unspecified, uncomplicated: Secondary | ICD-10-CM | POA: Insufficient documentation

## 2017-04-23 MED ORDER — HYDROXYZINE HCL 50 MG PO TABS
50.0000 mg | ORAL_TABLET | Freq: Once | ORAL | Status: AC
  Administered 2017-04-23: 50 mg via ORAL
  Filled 2017-04-23: qty 1

## 2017-04-23 MED ORDER — HYDROXYZINE HCL 50 MG PO TABS: 50.0000 mg | ORAL_TABLET | Freq: Three times a day (TID) | ORAL | 0 refills | Status: DC | PRN

## 2017-04-23 MED ORDER — METHYLPREDNISOLONE 4 MG PO TBPK: ORAL_TABLET | ORAL | 0 refills | Status: DC

## 2017-04-23 MED ORDER — DEXAMETHASONE SODIUM PHOSPHATE 10 MG/ML IJ SOLN
10.0000 mg | Freq: Once | INTRAMUSCULAR | Status: AC
  Administered 2017-04-23: 10 mg via INTRAMUSCULAR
  Filled 2017-04-23: qty 1

## 2017-04-23 NOTE — ED Notes (Signed)
See triage note  Presents with questionable rash to both elbows ,back and leg  States started after shoveling salt about 1 week ago   States area itches no resp distress noted
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
## 2017-04-23 NOTE — ED Triage Notes (Signed)
Pt started with rash 1 week ago after shoveling salt.  Rash to elbows, right knee and back per pt.  Itches in nature. Has not tried anything OTC for rash.  NAD. respiration unlabored.

## 2018-10-04 ENCOUNTER — Emergency Department
Admission: EM | Admit: 2018-10-04 | Discharge: 2018-10-04 | Disposition: A | Discharge status: Home / Self Care | Payer: Medicaid Other | Attending: Emergency Medicine | Admitting: Emergency Medicine

## 2018-10-04 ENCOUNTER — Other Ambulatory Visit: Payer: Self-pay

## 2018-10-04 ENCOUNTER — Encounter: Payer: Self-pay | Admitting: Medical Oncology

## 2018-10-04 DIAGNOSIS — H5789 Other specified disorders of eye and adnexa: Secondary | ICD-10-CM | POA: Insufficient documentation

## 2018-10-04 DIAGNOSIS — Z9049 Acquired absence of other specified parts of digestive tract: Secondary | ICD-10-CM | POA: Insufficient documentation

## 2018-10-04 DIAGNOSIS — F17200 Nicotine dependence, unspecified, uncomplicated: Secondary | ICD-10-CM | POA: Insufficient documentation

## 2018-10-04 MED ORDER — TOBRAMYCIN 0.3 % OP SOLN: 2.0000 [drp] | OPHTHALMIC | 0 refills | Status: AC

## 2018-10-04 MED ORDER — TETRACAINE HCL 0.5 % OP SOLN
2.0000 [drp] | Freq: Once | OPHTHALMIC | Status: AC
  Administered 2018-10-04: 2 [drp] via OPHTHALMIC
  Filled 2018-10-04: qty 4

## 2018-10-04 MED ORDER — FLUORESCEIN SODIUM 1 MG OP STRP
1.0000 | ORAL_STRIP | Freq: Once | OPHTHALMIC | Status: AC
  Administered 2018-10-04: 12:00:00 1 via OPHTHALMIC
  Filled 2018-10-04: qty 1

## 2018-10-04 NOTE — ED Provider Notes (Signed)
[facility] Emergency Department Provider Note  ____________________________________________   First MD Initiated Contact with Patient 10/04/18 1058     (approximate)  I have reviewed the triage vital signs and the nursing notes.   HISTORY  Chief Complaint Eye Problem   HPI Eric Copeland is a 28 y.o. male presents to the ED with complaint of left arm redness and irritation since [place].  Patient states that he has been using over-the-counter eyedrops without any relief.  He denies any visual changes or photophobia.  He is not aware of any drainage from his eyes.  He rates his pain as 7 out of 10.      History reviewed. No pertinent past medical history.  There are no active problems to display for this patient.   Past Surgical History:  Procedure Laterality Date  . CHOLECYSTECTOMY      Prior to Admission medications   Medication Sig Start Date End Date Taking? Authorizing Provider  tobramycin (TOBREX) 0.3 % ophthalmic solution Place 2 drops into the left eye every 4 (four) hours. While awake 10/04/18   Johnn Hai, PA-C    Allergies Codeine  History reviewed. No pertinent family history.  Social History Social History   Tobacco Use  . Smoking status: Current Every Day Smoker  . Smokeless tobacco: Never Used  Substance Use Topics  . Alcohol use: No  . Drug use: No    Review of Systems Constitutional: No fever/chills Eyes: No visual changes.  Positive for left eye irritation. ENT: No sore throat. Cardiovascular: Denies chest pain. Respiratory: Denies shortness of breath. Gastrointestinal:   No nausea, no vomiting. Musculoskeletal: Negative for back pain. Skin: Negative for rash. Neurological: Negative for headaches, focal weakness or numbness. ____________________________________________   PHYSICAL EXAM:  VITAL SIGNS: ED Triage Vitals  Enc Vitals Group     BP 10/04/18 1035 137/85   Pulse Rate 10/04/18 1035 83     Resp 10/04/18 1035 18     Temp 10/04/18 1035 97.6 F (36.4 C)     Temp Source 10/04/18 1035 Oral     SpO2 10/04/18 1035 98 %     Weight 10/04/18 1033 189 lb 9.5 oz (86 kg)     Height 10/04/18 1034 5\' 8"  (1.727 m)     Head Circumference --      Peak Flow --      Pain Score 10/04/18 1033 7     Pain Loc --      Pain Edu? --      Excl. in [place]? --     Constitutional: Alert and oriented. Well appearing and in no acute distress. Eyes: Conjunctivae on left is minimally injected.  Lid was inverted and no foreign body was noted.  PERRL. EOMI.  Head: Atraumatic. Nose: No congestion/rhinnorhea. Neck: No stridor.   Cardiovascular: Normal rate, regular rhythm. Grossly normal heart sounds.  Good peripheral circulation. Respiratory: Normal respiratory effort.  No retractions. Lungs CTAB. Gastrointestinal: Soft and nontender. No distention.  Musculoskeletal: Moves upper and lower extremities that any difficulty. Neurologic:  Normal speech and language. No gross focal neurologic deficits are appreciated.. Skin:  Skin is warm, dry and intact. No rash noted. Psychiatric: Mood and affect are normal. Speech and behavior are normal.  ____________________________________________   LABS (all labs ordered are listed, but only abnormal results are displayed)  Labs Reviewed - No data to display  PROCEDURES  Procedure(s) performed (including Critical Care):  Procedures  Tetracaine was applied to the left eye.  Upper lid was everted and no foreign body was noted.  Fluorescein dye was placed in the eye there was no corneal abrasion present. Eye wash solution was used to irrigate the eye.  ____________________________________________   INITIAL IMPRESSION / ASSESSMENT AND PLAN / ED COURSE  As part of my medical decision making, I reviewed the following data within the electronic MEDICAL RECORD NUMBER Notes from prior ED visits and [place] Controlled Substance Database    28 year old male presents to the ED with complaint of irritation and redness to his left eye after [place].  He denied any photophobia or difficulty with vision.  He is used over-the-counter eyedrops without any relief.  Eye exam was benign with no corneal abrasions or foreign bodies noted.  Patient was placed on Tobrex ophthalmic solution 2 drops in left eye every 4 hours while awake.  If there is no improvement he is to follow-up with [facility]  Contact information was given to him.  ____________________________________________   FINAL CLINICAL IMPRESSION(S) / ED DIAGNOSES  Final diagnoses:  Eye irritation     ED Discharge Orders         Ordered    tobramycin (TOBREX) 0.3 % ophthalmic solution  Every 4 hours     10/04/18 1144           Note:  This document was prepared using Dragon voice recognition software and may include unintentional dictation errors.    Johnn Hai, PA-C 10/04/18 1259    Arta Silence, MD 10/04/18 1347

## 2018-10-04 NOTE — Discharge Instructions (Addendum)
Follow-up with Dr. Edison Pace if any continued problems or not improving.  Begin using eyedrops to your left every 4 hours while awake.  Wear sunglasses when you are out in the sun if there is any eye irritation

## 2018-10-04 NOTE — ED Triage Notes (Signed)
Pt reports left eye irritation since [place] the [place] Saturday.

## 2018-10-04 NOTE — ED Notes (Signed)
See triage note  Presents with left eye redness and irritation  States he went swimming and noticed that his eye was itchy

## 2019-09-21 ENCOUNTER — Emergency Department
Admission: EM | Admit: 2019-09-21 | Discharge: 2019-09-21 | Discharge status: Absent without leave | Payer: Medicaid Other

## 2020-06-30 ENCOUNTER — Emergency Department: Payer: Self-pay

## 2020-06-30 ENCOUNTER — Emergency Department
Admission: EM | Admit: 2020-06-30 | Discharge: 2020-06-30 | Disposition: A | Discharge status: Home / Self Care | Payer: Self-pay | Attending: Emergency Medicine | Admitting: Emergency Medicine

## 2020-06-30 ENCOUNTER — Other Ambulatory Visit: Payer: Self-pay

## 2020-06-30 ENCOUNTER — Encounter: Payer: Self-pay | Admitting: Emergency Medicine

## 2020-06-30 DIAGNOSIS — F17200 Nicotine dependence, unspecified, uncomplicated: Secondary | ICD-10-CM | POA: Insufficient documentation

## 2020-06-30 DIAGNOSIS — K219 Gastro-esophageal reflux disease without esophagitis: Secondary | ICD-10-CM | POA: Insufficient documentation

## 2020-06-30 DIAGNOSIS — S56912A Strain of unspecified muscles, fascia and tendons at forearm level, left arm, initial encounter: Secondary | ICD-10-CM | POA: Insufficient documentation

## 2020-06-30 DIAGNOSIS — T148XXA Other injury of unspecified body region, initial encounter: Secondary | ICD-10-CM

## 2020-06-30 DIAGNOSIS — R609 Edema, unspecified: Secondary | ICD-10-CM

## 2020-06-30 DIAGNOSIS — D1721 Benign lipomatous neoplasm of skin and subcutaneous tissue of right arm: Secondary | ICD-10-CM | POA: Insufficient documentation

## 2020-06-30 DIAGNOSIS — X58XXXA Exposure to other specified factors, initial encounter: Secondary | ICD-10-CM | POA: Insufficient documentation

## 2020-06-30 DIAGNOSIS — M25512 Pain in left shoulder: Secondary | ICD-10-CM | POA: Insufficient documentation

## 2020-06-30 DIAGNOSIS — D1720 Benign lipomatous neoplasm of skin and subcutaneous tissue of unspecified limb: Secondary | ICD-10-CM

## 2020-06-30 MED ORDER — PANTOPRAZOLE SODIUM 40 MG PO TBEC: 40.0000 mg | DELAYED_RELEASE_TABLET | Freq: Every day | ORAL | 1 refills | Status: AC

## 2020-06-30 MED ORDER — BACLOFEN 10 MG PO TABS: 10.0000 mg | ORAL_TABLET | Freq: Three times a day (TID) | ORAL | 0 refills | Status: AC

## 2020-06-30 NOTE — ED Provider Notes (Signed)
[facility] Emergency Department Provider Note  ____________________________________________   Event Date/Time   First MD Initiated Contact with Patient 06/30/20 1637     (approximate)  I have reviewed the triage vital signs and the nursing notes.   HISTORY  Chief Complaint Arm Pain    HPI Eric Copeland is a 30 y.o. male presents emergency department complaining of left elbow pain along with left shoulder pain.  Patient states he did some weed eating and mowed the grass and noticed a large lump at the medial aspect of the left elbow.  States hurts to turn his neck.  No numbness or tingling.  Also states after he eats he hurts up in the left upper quadrant.  Patient does not have a gallbladder.  He has had no nausea or vomiting.  No history of ulcers.    History reviewed. No pertinent past medical history.  There are no problems to display for this patient.   Past Surgical History:  Procedure Laterality Date  . CHOLECYSTECTOMY      Prior to Admission medications   Medication Sig Start Date End Date Taking? Authorizing Provider  baclofen (LIORESAL) 10 MG tablet Take 1 tablet (10 mg total) by mouth 3 (three) times daily for 10 days. 06/30/20 07/10/20 Yes Delaine Canter, Linden Dolin, PA-C  pantoprazole (PROTONIX) 40 MG tablet Take 1 tablet (40 mg total) by mouth daily. 06/30/20 06/30/21 Yes Avik Leoni, Linden Dolin, PA-C  tobramycin (TOBREX) 0.3 % ophthalmic solution Place 2 drops into the left eye every 4 (four) hours. While awake 10/04/18   Johnn Hai, PA-C    Allergies Codeine  History reviewed. No pertinent family history.  Social History Social History   Tobacco Use  . Smoking status: Current Every Day Smoker  . Smokeless tobacco: Never Used  Substance Use Topics  . Alcohol use: No  . Drug use: No    Review of Systems  Constitutional: No fever/chills Eyes: No visual changes. ENT: No sore throat. Respiratory: Denies cough Cardiovascular: Denies chest  pain Gastrointestinal: Denies abdominal pain Genitourinary: Negative for dysuria. Musculoskeletal: Negative for back pain.  Positive left elbow and left shoulder pain Skin: Negative for rash. Psychiatric: no mood changes,     ____________________________________________   PHYSICAL EXAM:  VITAL SIGNS: ED Triage Vitals [06/30/20 1600]  Enc Vitals Group     BP (!) 147/89     Pulse Rate 94     Resp 20     Temp 98.4 F (36.9 C)     Temp Source Oral     SpO2 97 %     Weight 250 lb (113.4 kg)     Height 5\' 10"  (1.778 m)     Head Circumference      Peak Flow      Pain Score 8     Pain Loc      Pain Edu?      Excl. in [place]?     Constitutional: Alert and oriented. Well appearing and in no acute distress. Eyes: Conjunctivae are normal.  Head: Atraumatic. Nose: No congestion/rhinnorhea. Mouth/Throat: Mucous membranes are moist.   Neck:  supple no lymphadenopathy noted Cardiovascular: Normal rate, regular rhythm.  Respiratory: Normal respiratory effort.  No retractions,  GU: deferred Musculoskeletal: FROM all extremities, warm and well perfused, left elbow has a soft tissue lump noted around the medial aspect, left bicep is more pronounced than the right, neurovascular is intact, large spasm noted in the left shoulder Neurologic:  Normal speech and  language.  Skin:  Skin is warm, dry and intact. No rash noted. Psychiatric: Mood and affect are normal. Speech and behavior are normal.  ____________________________________________   LABS (all labs ordered are listed, but only abnormal results are displayed)  Labs Reviewed - No data to display ____________________________________________   ____________________________________________  RADIOLOGY  X-ray left elbow  ____________________________________________   PROCEDURES  Procedure(s) performed: No  Procedures    ____________________________________________   INITIAL IMPRESSION / ASSESSMENT AND PLAN / ED  COURSE  Pertinent labs & imaging results that were available during my care of the patient were reviewed by me and considered in my medical decision making (see chart for details).   Patient is 30 year old male presents with left elbow pain left shoulder pain, and pain in the left upper quadrant after eating.  See HPI.  Physical exam shows patient to appear stable.  X-ray of the left elbow to determine any bony abnormality If normal then will order ultrasound of the left elbow to determine if this is a early abscess   X-ray of the left elbow reviewed by me confirmed by radiology to be negative Ultrasound does not show any abscess and musculature is intact  The explained findings to the patient.  Eric Copeland could be a lipoma.  If he continues to have pain he should follow-up with orthopedics.  He was given a prescription for a muscle relaxer for the spasms in his neck, Protonix for his GERD, he was instructed follow-up with his regular doctor.  Is discharged stable condition.  Eric Copeland was evaluated in Emergency Department on 06/30/2020 for the symptoms described in the history of present illness. He was evaluated in the context of the global COVID-19 pandemic, which necessitated consideration that the patient might be at risk for infection with the SARS-CoV-2 virus that causes COVID-19. Institutional protocols and algorithms that pertain to the evaluation of patients at risk for COVID-19 are in a state of rapid change based on information released by regulatory bodies including the CDC and federal and state organizations. These policies and algorithms were followed during the patient's care in the ED.    As part of my medical decision making, I reviewed the following data within the [place] notes reviewed and incorporated, Old chart reviewed, Radiograph reviewed , Notes from prior ED visits and [place] Controlled Substance  Database  ____________________________________________   FINAL CLINICAL IMPRESSION(S) / ED DIAGNOSES  Final diagnoses:  Swelling  Lipoma of upper extremity, unspecified laterality  Muscle strain  Gastroesophageal reflux disease without esophagitis      NEW MEDICATIONS STARTED DURING THIS VISIT:  New Prescriptions   BACLOFEN (LIORESAL) 10 MG TABLET    Take 1 tablet (10 mg total) by mouth 3 (three) times daily for 10 days.   PANTOPRAZOLE (PROTONIX) 40 MG TABLET    Take 1 tablet (40 mg total) by mouth daily.     Note:  This document was prepared using Dragon voice recognition software and may include unintentional dictation errors.    Versie Starks, PA-C 06/30/20 1932    Lavonia Drafts, MD 06/30/20 2015

## 2020-06-30 NOTE — Discharge Instructions (Addendum)
Follow-up with orthopedics if your arm neck and shoulders are not better in 1 week.  Or you may see a chiropractor.  Follow-up with your regular doctor concerning the reflux type symptoms.  Try the Protonix to see if this helps.  This is also an over-the-counter medication which if you run out of medicine you could take by buying it at any pharmacy or [place]. Return emergency department worsening

## 2020-06-30 NOTE — ED Triage Notes (Signed)
Pt via POV from home. Pt c/o L forearm pain and L shoulder pain that started today. Pt has a small knot to the L forearm. Denies any strenuous work. Pt is A&Ox4 and NAD. Denies CP and SOB. Denies NVD. Pt states he has been taking Tylenol with no relief.

## 2021-03-04 ENCOUNTER — Other Ambulatory Visit: Payer: Self-pay

## 2021-03-04 ENCOUNTER — Emergency Department
Admission: EM | Admit: 2021-03-04 | Discharge: 2021-03-04 | Disposition: A | Discharge status: Home / Self Care | Payer: Medicaid Other | Attending: Emergency Medicine | Admitting: Emergency Medicine

## 2021-03-04 ENCOUNTER — Emergency Department: Payer: Medicaid Other

## 2021-03-04 ENCOUNTER — Encounter: Payer: Self-pay | Admitting: Emergency Medicine

## 2021-03-04 DIAGNOSIS — G44209 Tension-type headache, unspecified, not intractable: Secondary | ICD-10-CM

## 2021-03-04 DIAGNOSIS — R519 Headache, unspecified: Secondary | ICD-10-CM | POA: Insufficient documentation

## 2021-03-04 DIAGNOSIS — M542 Cervicalgia: Secondary | ICD-10-CM | POA: Insufficient documentation

## 2021-03-04 DIAGNOSIS — F17200 Nicotine dependence, unspecified, uncomplicated: Secondary | ICD-10-CM | POA: Insufficient documentation

## 2021-03-04 DIAGNOSIS — M6248 Contracture of muscle, other site: Secondary | ICD-10-CM | POA: Insufficient documentation

## 2021-03-04 DIAGNOSIS — R200 Anesthesia of skin: Secondary | ICD-10-CM | POA: Insufficient documentation

## 2021-03-04 MED ORDER — KETOROLAC TROMETHAMINE 10 MG PO TABS: 10.0000 mg | ORAL_TABLET | Freq: Four times a day (QID) | ORAL | 0 refills | Status: AC | PRN

## 2021-03-04 MED ORDER — METHOCARBAMOL 500 MG PO TABS: 500.0000 mg | ORAL_TABLET | Freq: Four times a day (QID) | ORAL | 0 refills | Status: AC | PRN

## 2021-03-04 MED ORDER — KETOROLAC TROMETHAMINE 30 MG/ML IJ SOLN
30.0000 mg | Freq: Once | INTRAMUSCULAR | Status: AC
  Administered 2021-03-04: 30 mg via INTRAMUSCULAR
  Filled 2021-03-04: qty 1

## 2021-03-04 NOTE — ED Triage Notes (Signed)
Pt reports that he developed pain in both eyes and the back of his head a few weeks ago, then it started to feel numb on the right side of his face and head. He denies any injury. He did go to his PMD and they gave him some migraine medication which has not helped out with the numbness. (Feels like "I had novacaine"). His neck feels like it has a crink in it also that all started all with the numbness

## 2021-03-04 NOTE — ED Provider Notes (Signed)
[facility] Emergency Department Provider Note   ____________________________________________   Event Date/Time   First MD Initiated Contact with Patient 03/04/21 1227     (approximate)  I have reviewed the triage vital signs and the nursing notes.   HISTORY  Chief Complaint Numbness    HPI Eric Copeland is a 30 y.o. male presents to the ED with complaint of neck pain that now goes to the right side of his head that started few weeks ago.  He states that initially his neck felt like "I slept wrong".  He denies any injury to his neck, no nausea, vomiting, visual changes with this.  He was seen by his PCP yesterday and was prescribed omeprazole and Maxalt which he states has not helped.  He reports that he is not have a history of migraine headaches.  Today he continues to have some numbness to the right side of his scalp.  There is still no nausea, vomiting, vision disturbance or light sensitivity.  He denies any upper respiratory symptoms, fever or chills.  He has in the past had seasonal allergies and "sinus infections".  He rates his pain as a 10/10.        History reviewed. No pertinent past medical history.  There are no problems to display for this patient.   Past Surgical History:  Procedure Laterality Date   CHOLECYSTECTOMY      Prior to Admission medications   Medication Sig Start Date End Date Taking? Authorizing Provider  ketorolac (TORADOL) 10 MG tablet Take 1 tablet (10 mg total) by mouth every 6 (six) hours as needed for moderate pain. 03/04/21  Yes Johnn Hai, PA-C  methocarbamol (ROBAXIN) 500 MG tablet Take 1 tablet (500 mg total) by mouth every 6 (six) hours as needed. 03/04/21  Yes Letitia Neri L, PA-C  pantoprazole (PROTONIX) 40 MG tablet Take 1 tablet (40 mg total) by mouth daily. 06/30/20 06/30/21  Fisher, Linden Dolin, PA-C  tobramycin (TOBREX) 0.3 % ophthalmic solution Place 2 drops into the left eye every 4 (four) hours. While  awake 10/04/18   Johnn Hai, PA-C    Allergies Codeine  History reviewed. No pertinent family history.  Social History Social History   Tobacco Use   Smoking status: Every Day   Smokeless tobacco: Never  Substance Use Topics   Alcohol use: No   Drug use: No    Review of Systems Constitutional: No fever/chills Eyes: No visual changes.  No photophobia. ENT: No sore throat. Cardiovascular: Denies chest pain. Respiratory: Denies shortness of breath. Gastrointestinal: No abdominal pain.  No nausea, no vomiting.  No diarrhea.  No constipation. Musculoskeletal: Negative for back pain. Skin: Negative for rash. Neurological: Positive for generalized headache.  Positive right scalp numbness sensation.  ____________________________________________   PHYSICAL EXAM:  VITAL SIGNS: ED Triage Vitals  Enc Vitals Group     BP 03/04/21 1154 (!) 136/96     Pulse Rate 03/04/21 1154 73     Resp 03/04/21 1154 18     Temp 03/04/21 1154 98 F (36.7 C)     Temp Source 03/04/21 1154 Oral     SpO2 03/04/21 1154 96 %     Weight 03/04/21 1211 250 lb (113.4 kg)     Height 03/04/21 1211 5\' 9"  (1.753 m)     Head Circumference --      Peak Flow --      Pain Score 03/04/21 1210 10     Pain Loc --  Pain Edu? --      Excl. in [place]? --     Constitutional: Alert and oriented. Well appearing and in no acute distress.  Patient is talkative, appropriate, and ambulatory without any assistance. Eyes: Conjunctivae are normal. PERRL. EOMI. Head: Atraumatic. Nose: No congestion/rhinnorhea. Mouth/Throat: Mucous membranes are moist.  Oropharynx non-erythematous. Neck: No stridor.  No point tenderness on palpation of cervical spine however the right cervical and trapezius area is tender to palpation.  Range of motion is without restriction in all 4 planes. Hematological/Lymphatic/Immunilogical: No cervical lymphadenopathy. Cardiovascular: Normal rate, regular rhythm. Grossly normal heart sounds.   Good peripheral circulation. Respiratory: Normal respiratory effort.  No retractions. Lungs CTAB. Gastrointestinal: Soft and nontender. No distention.  Musculoskeletal: Is able to move upper and lower extremities without any difficulty.  Good muscle strength bilaterally.  Patient is ambulatory without any assistance. Neurologic:  Normal speech and language.  Cranial nerves II through XII grossly intact.  No gross focal neurologic deficits are appreciated. No gait instability. Skin:  Skin is warm, dry and intact. No rash noted. Psychiatric: Mood and affect are normal. Speech and behavior are normal.  ____________________________________________   LABS (all labs ordered are listed, but only abnormal results are displayed)  Labs Reviewed - No data to display ____________________________________________  ____________________________________________  RADIOLOGY Leana Gamer, personally viewed and evaluated these images (plain radiographs) as part of my medical decision making, as well as reviewing the written report by the radiologist.   Official radiology report(s): CT Head Wo Contrast  Result Date: 03/04/2021 CLINICAL DATA:  A 30 year old male presents with numbness or tingling and paresthesia of a few weeks duration. EXAM: CT HEAD WITHOUT CONTRAST TECHNIQUE: Contiguous axial images were obtained from the base of the skull through the vertex without intravenous contrast. COMPARISON:  None. FINDINGS: Brain: No evidence of acute infarction, hemorrhage, hydrocephalus, extra-axial collection or mass lesion/mass effect. Vascular: No hyperdense vessel or unexpected calcification. Skull: Normal. Negative for fracture or focal lesion. Sinuses/Orbits: Mucous retention cyst or polyp in RIGHT maxillary sinus, incompletely visualized. No fluid levels in the sinuses or substantial mucosal thickening. No acute orbital findings. Other: None. IMPRESSION: No acute intracranial abnormality. Mucous  retention cyst or polyp in the RIGHT maxillary sinus, incompletely visualized. Electronically Signed   By: Zetta Bills M.D.   On: 03/04/2021 12:41    ____________________________________________   PROCEDURES  Procedure(s) performed (including Critical Care):  Procedures   ____________________________________________   INITIAL IMPRESSION / ASSESSMENT AND PLAN / ED COURSE  As part of my medical decision making, I reviewed the following data within the electronic MEDICAL RECORD NUMBER Notes from prior ED visits and [place] Controlled Substance Database  30 year old male presents to the ED with several weeks of a headache like sensation with numbness to the right side of his scalp that started this morning.  He was seen by his PCP yesterday and started on Maxalt which she states has not helped any with his symptoms.  CT scan was performed and patient was told that he has a retention cyst/polyp on the right side.  With the tenderness he is experiencing on the right cervical area into the trapezius muscles it was felt that this was more a muscle contraction headache.  Patient was given Toradol 30 mg IM and a prescription for methocarbamol 500 mg every 6 hours along with Toradol 10 mg p.o. every 6 hours with food was sent to his pharmacy.  He is to follow-up with his PCP if any continued problems.  Patient was given a note for work and also encouraged to use ice or heat to his neck for discomfort.  ____________________________________________   FINAL CLINICAL IMPRESSION(S) / ED DIAGNOSES  Final diagnoses:  Muscle contraction headache     ED Discharge Orders          Ordered    methocarbamol (ROBAXIN) 500 MG tablet  Every 6 hours PRN        03/04/21 1356    ketorolac (TORADOL) 10 MG tablet  Every 6 hours PRN        03/04/21 1356             Note:  This document was prepared using Dragon voice recognition software and may include unintentional dictation errors.    Johnn Hai,  PA-C 03/04/21 1416    Carrie Mew, MD 03/05/21 2312

## 2021-03-04 NOTE — Discharge Instructions (Signed)
Follow-up with your primary care provider if any continued problems or continued need for medication.  On today's exam you do not have a lesion or evidence of a stroke on your CT scan.  Most likely you are having a muscle contraction headache with involvement into your neck.  You were given an injection which should begin helping but also 2 prescriptions were sent to your pharmacy.  The methocarbamol can cause drowsiness and should not be taken while driving or operating machinery.  The other medication is to be taken every 6 hours with food for the next 3 days.  You may also use ice or heat to the back of your neck to help with these muscles.  Continue stay hydrated.  Return to the emergency department if any severe worsening of your symptoms.

## 2022-07-23 IMAGING — US US EXTREM UP*L* LTD
1 series · 14 of 16 positions shown · non-contrast
Comparison: None.

CLINICAL DATA: Swelling in the left upper extremity for 2 days

EXAM:
ULTRASOUND LEFT UPPER EXTREMITY LIMITED
TECHNIQUE: Ultrasound examination of the upper extremity soft tissues was
performed in the area of clinical concern.

[Series 1: us soft tissue upper extremity limited left (non-v · 16 acquisitions, 14 frames shown]
[im 1/16]
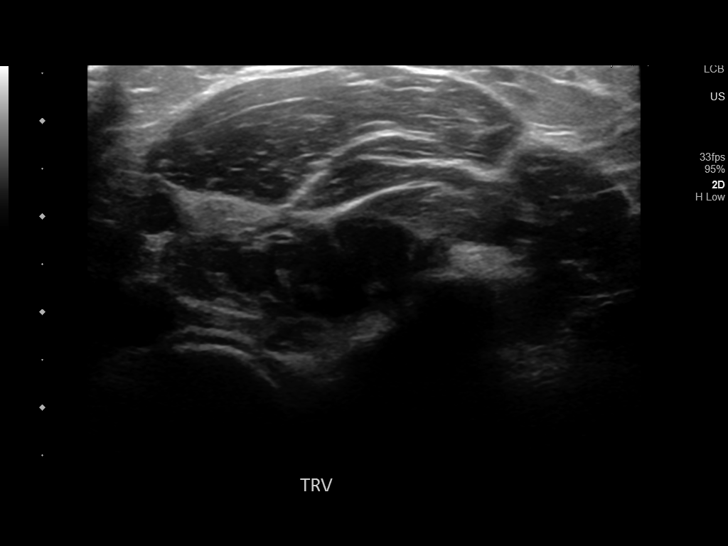
[im 2/16]
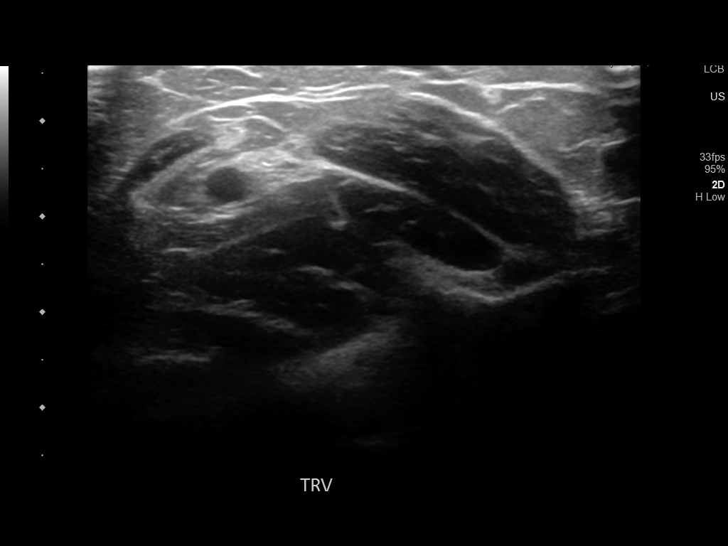
[im 3/16]
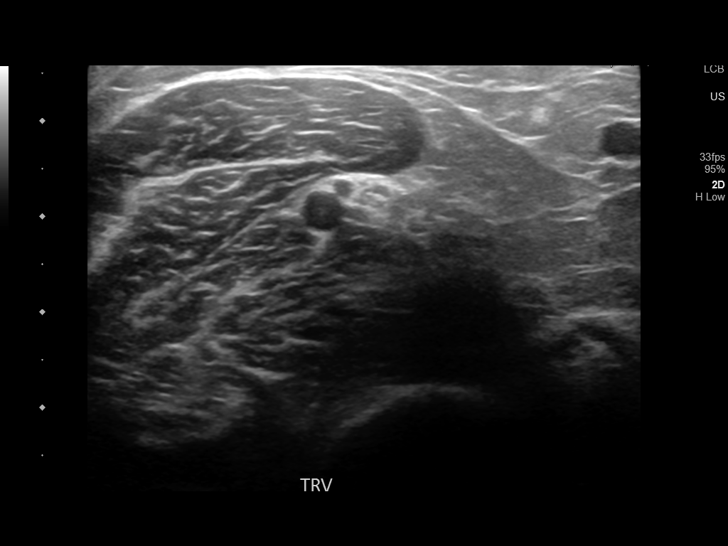
[im 5/16]
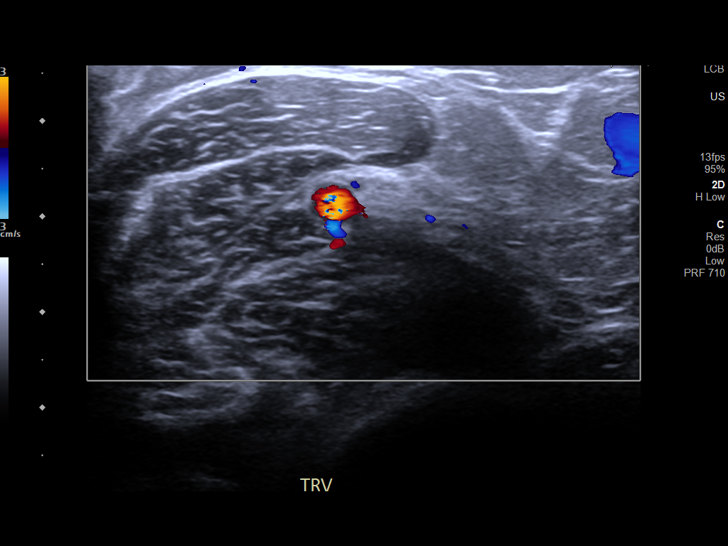
[im 6/16]
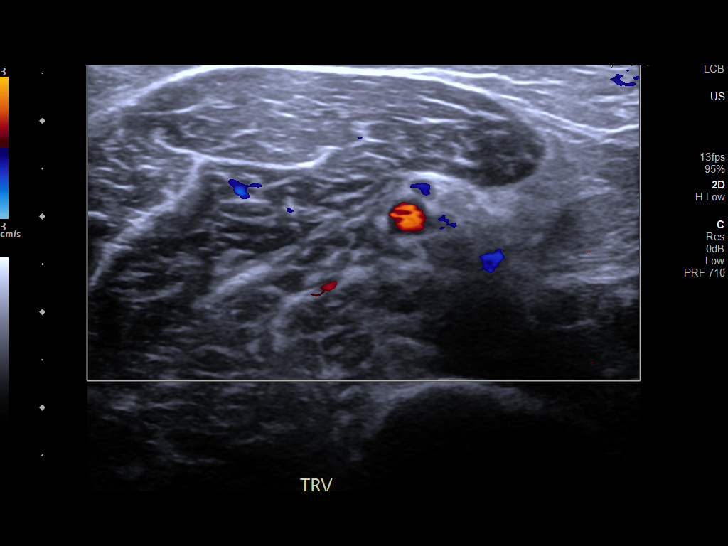
[im 7/16]
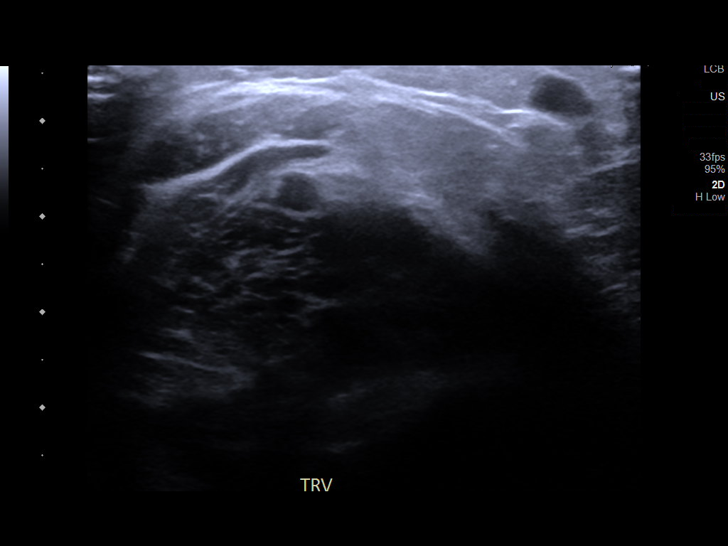
[im 8/16]
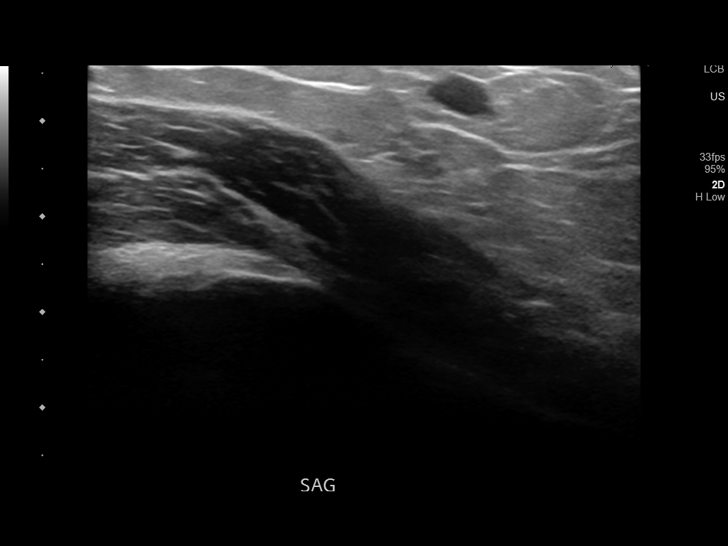
[im 9/16]
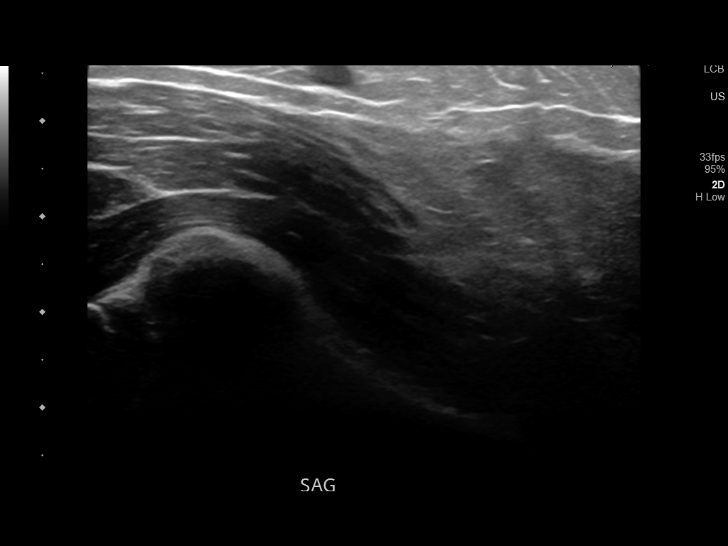
[im 10/16]
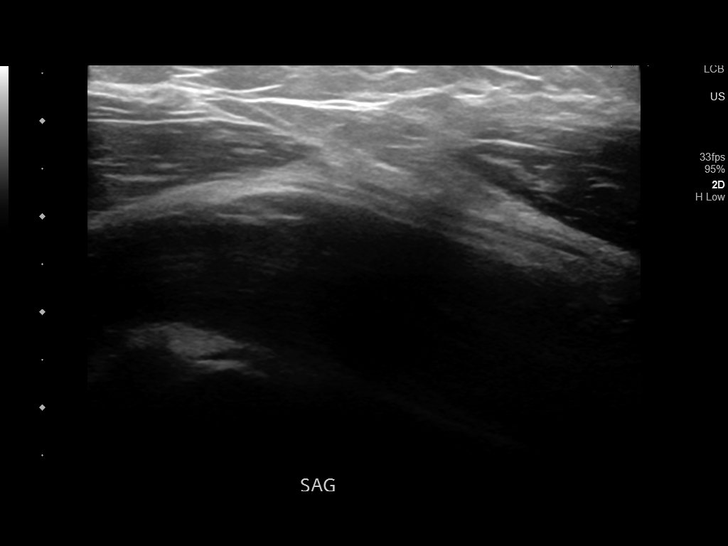
[im 11/16]
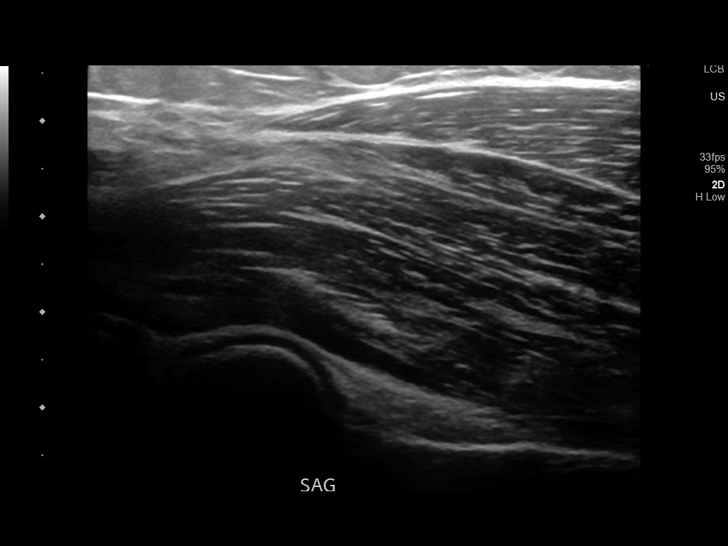
[im 13/16]
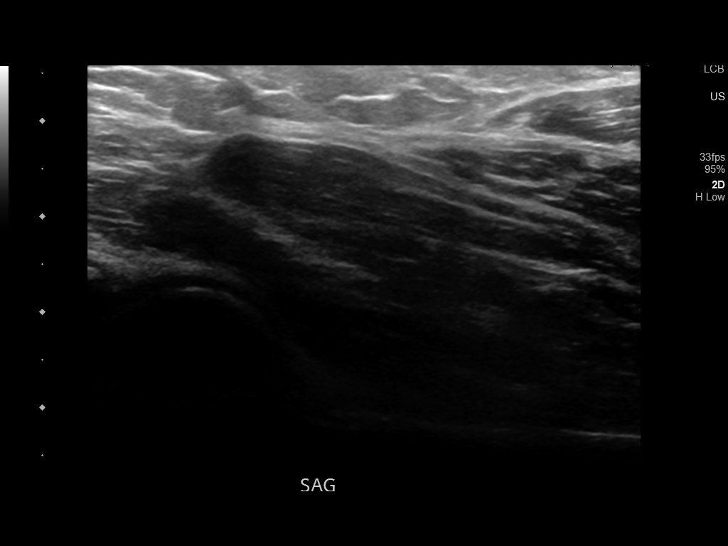
[im 14/16]
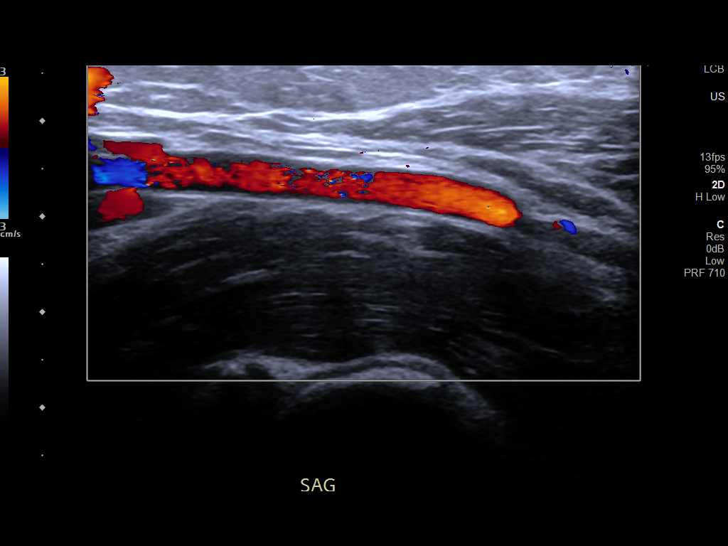
[im 15/16]
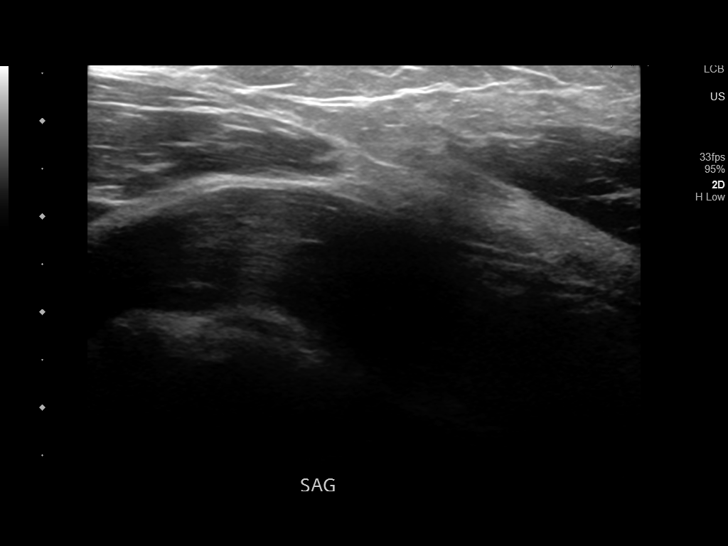
[im 16/16]
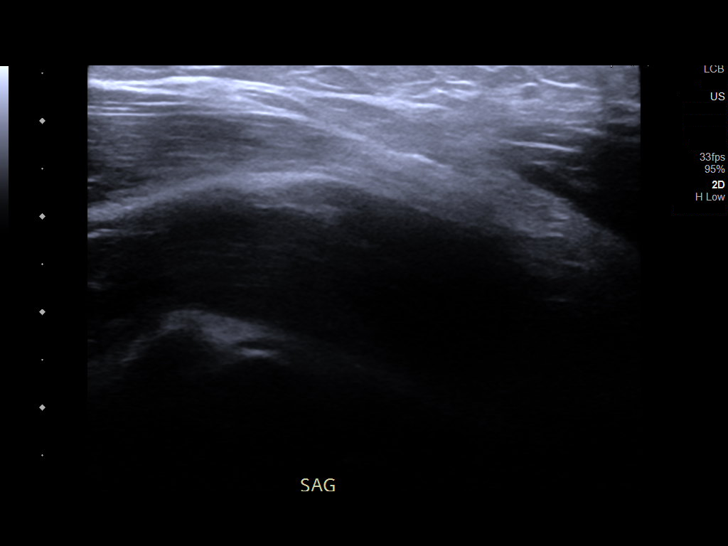

[14 of 16 positions shown; findings below may reference images not displayed]

FINDINGS: Scanning in the area of clinical concern shows no evidence of focal
mass lesion or significant subcutaneous edema. No muscular
abnormality is seen. No vascular abnormality is noted.
IMPRESSION: No acute abnormality identified in the area of clinical concern.

## 2022-12-09 ENCOUNTER — Other Ambulatory Visit: Payer: Self-pay

## 2022-12-09 ENCOUNTER — Encounter: Payer: Self-pay | Admitting: Emergency Medicine

## 2022-12-09 ENCOUNTER — Emergency Department: Payer: Medicaid Other

## 2022-12-09 ENCOUNTER — Emergency Department
Admission: EM | Admit: 2022-12-09 | Discharge: 2022-12-09 | Disposition: A | Discharge status: Home / Self Care | Payer: Medicaid Other | Attending: Emergency Medicine | Admitting: Emergency Medicine

## 2022-12-09 DIAGNOSIS — M79605 Pain in left leg: Secondary | ICD-10-CM | POA: Insufficient documentation

## 2022-12-09 MED ORDER — MELOXICAM 15 MG PO TABS: 15.0000 mg | ORAL_TABLET | Freq: Every day | ORAL | 0 refills | Status: AC

## 2022-12-09 NOTE — ED Triage Notes (Signed)
Pt via POV from home. Pt c/o L pain states it is a cramping pain starting on Saturday but got worse on Monday when he went ot work. States the pain started posterior aspect of the L leg and states its just a "knot" on the back of his calf. Denies hx of DVT. Pt is A&Ox4 and NAD, ambulatory to triage.

## 2022-12-09 NOTE — Discharge Instructions (Signed)
Please take the meloxicam once a day for 14 days.  You can take 650 mg of Tylenol every 6 hours as needed for pain.  I have attached information about patellar tendinitis which I believe is the cause of your knee pain.  I also attached some stretches that you can try which will improve your leg pain.

## 2022-12-09 NOTE — ED Provider Notes (Signed)
[facility] Provider Note    Event Date/Time   First MD Initiated Contact with Patient 12/09/22 1154     (approximate)   History   Leg Pain   HPI  Eric Copeland is a 32 y.o. male with no PMH who presents for evaluation of left-sided leg pain.  Patient states that his pain began on Saturday but got worse on Monday.  He has noticed a knot in his calf and states that most of the pain is behind his knee.  He says that he is on his feet and does lots of walking all day long while at work.  He has taken some Tylenol but has not had good pain relief.  He denies chest pain and SOB.     Physical Exam   Triage Vital Signs: ED Triage Vitals  Encounter Vitals Group     BP 12/09/22 1142 (!) 142/88     Systolic BP Percentile --      Diastolic BP Percentile --      Pulse Rate 12/09/22 1142 82     Resp 12/09/22 1142 18     Temp 12/09/22 1142 98.2 F (36.8 C)     Temp Source 12/09/22 1142 Oral     SpO2 12/09/22 1142 98 %     Weight 12/09/22 1140 270 lb (122.5 kg)     Height 12/09/22 1140 5\' 9"  (1.753 m)     Head Circumference --      Peak Flow --      Pain Score 12/09/22 1140 7     Pain Loc --      Pain Education --      Exclude from Growth Chart --     Most recent vital signs: Vitals:   12/09/22 1142  BP: (!) 142/88  Pulse: 82  Resp: 18  Temp: 98.2 F (36.8 C)  SpO2: 98%    General: Awake, no distress. CV:  Good peripheral perfusion.  Resp:  Normal effort.  Abd:  No distention.  Other:  Small lump in the middle of patient's left calf, tender to palpation, no erythema or warmth.  Negative Thompson test.  Pain with compression of the calf muscles.   ED Results / Procedures / Treatments   Labs (all labs ordered are listed, but only abnormal results are displayed) Labs Reviewed - No data to display   RADIOLOGY  Left DVT study completed, interpreted the images as well as reviewed the radiologist report which was negative for DVT and  superficial thrombophlebitis.    PROCEDURES:  Critical Care performed: No  Procedures   MEDICATIONS ORDERED IN ED: Medications - No data to display   IMPRESSION / MDM / ASSESSMENT AND PLAN / ED COURSE  I reviewed the triage vital signs and the nursing notes.                             32 year old male presents for evaluation of left lower leg pain.  Elevated BP in triage, patient NAD on exam.  Differential diagnosis includes, but is not limited to, DVT, Baker's cyst, lipoma, muscle strain.  Patient's presentation is most consistent with acute complicated illness / injury requiring diagnostic workup.  Although my suspicion for DVT was low, I did get an ultrasound to rule it out.  I interpreted the images as well as reviewed the radiologist report which was in fact negative for a DVT and superficial thrombophlebitis.  I  believe patient's leg pain is musculoskeletal in origin.  Possible that he has a Baker's cyst or a calf strain.  Patient has been walking a significant amount while at work.  We discussed management with an anti-inflammatory, which I will prescribe, and Tylenol as needed for pain.  Patient will be prescribed meloxicam, he stated that he has taken ibuprofen before and has tolerated it well despite his allergy to aspirin.  Patient was agreeable to plan, voiced understanding, all questions were answered and he was stable at discharge.      FINAL CLINICAL IMPRESSION(S) / ED DIAGNOSES   Final diagnoses:  Left leg pain     Rx / DC Orders   ED Discharge Orders          Ordered    meloxicam (MOBIC) 15 MG tablet  Daily        12/09/22 1458             Note:  This document was prepared using Dragon voice recognition software and may include unintentional dictation errors.   Cameron Ali, PA-C 12/09/22 1501    Janith Lima, MD 12/09/22 [phone]

## 2024-01-05 ENCOUNTER — Emergency Department
Admission: EM | Admit: 2024-01-05 | Discharge: 2024-01-05 | Disposition: A | Discharge status: Home / Self Care | Attending: Emergency Medicine | Admitting: Emergency Medicine

## 2024-01-05 ENCOUNTER — Other Ambulatory Visit: Payer: Self-pay

## 2024-01-05 ENCOUNTER — Emergency Department

## 2024-01-05 DIAGNOSIS — R0789 Other chest pain: Secondary | ICD-10-CM | POA: Diagnosis present

## 2024-01-05 LAB — TROPONIN I (HIGH SENSITIVITY): Troponin I (High Sensitivity): 4 ng/L (ref <18)

## 2024-01-05 LAB — BASIC METABOLIC PANEL WITH GFR
Anion gap: 11 (ref 5–15)
BUN: 12 mg/dL (ref 6–20)
CO2: 22 mmol/L (ref 22–32)
Calcium: 9 mg/dL (ref 8.9–10.3)
Chloride: 105 mmol/L (ref 98–111)
Creatinine, Ser: 0.91 mg/dL (ref 0.61–1.24)
GFR, Estimated: >60 mL/min (ref >60)
Glucose, Bld: 101 mg/dL — ABNORMAL HIGH (ref 70–99)
Potassium: 3.9 mmol/L (ref 3.5–5.1)
Sodium: 138 mmol/L (ref 135–145)

## 2024-01-05 LAB — CBC
HCT: 50.1 % (ref 39.0–52.0)
Hemoglobin: 17.4 g/dL — ABNORMAL HIGH (ref 13.0–17.0)
MCH: 32.3 pg (ref 26.0–34.0)
MCHC: 34.7 g/dL (ref 30.0–36.0)
MCV: 92.9 fL (ref 80.0–100.0)
Platelets: 308 K/uL (ref 150–400)
RBC: 5.39 MIL/uL (ref 4.22–5.81)
RDW: 12.4 % (ref 11.5–15.5)
WBC: 11.4 K/uL — ABNORMAL HIGH (ref 4.0–10.5)
nRBC: 0 % (ref 0.0–0.2)

## 2024-01-05 MED ORDER — NAPROXEN 500 MG PO TABS: 500.0000 mg | ORAL_TABLET | Freq: Two times a day (BID) | ORAL | 2 refills | Status: AC

## 2024-01-05 NOTE — ED Triage Notes (Signed)
 C/O mid chest pain radiating up toward left shoulder and left ring finger pain. Pain in intermittent x 3 weeks.  AAOx3. Skin warm and dry. NAD

## 2024-01-05 NOTE — ED Provider Notes (Signed)
 [facility] Provider Note    Event Date/Time   First MD Initiated Contact with Patient 01/05/24 1153     (approximate)   History   Chest Pain   HPI  Eric Copeland is a 33 y.o. male who presents with complaints of chest wall discomfort, he reports this has been going on for many years, he reports it comes and goes.  Is just to the left of his sternum.  It is tender to touch.  He denies pleurisy.  No fevers or chills.  No injury to the area.  Has never had this worked up by cardiology or PCP     Physical Exam   Triage Vital Signs: ED Triage Vitals  Encounter Vitals Group     BP 01/05/24 1111 (!) 138/98     Girls Systolic BP Percentile --      Girls Diastolic BP Percentile --      Boys Systolic BP Percentile --      Boys Diastolic BP Percentile --      Pulse Rate 01/05/24 1111 78     Resp 01/05/24 1111 18     Temp 01/05/24 1111 98.2 F (36.8 C)     Temp Source 01/05/24 1111 Oral     SpO2 01/05/24 1111 96 %     Weight 01/05/24 1109 122.9 kg (270 lb 15.1 oz)     Height --      Head Circumference --      Peak Flow --      Pain Score 01/05/24 1109 9     Pain Loc --      Pain Education --      Exclude from Growth Chart --     Most recent vital signs: Vitals:   01/05/24 1111 01/05/24 1200  BP: (!) 138/98   Pulse: 78   Resp: 18   Temp: 98.2 F (36.8 C)   SpO2: 96% 96%     General: Awake, no distress.  CV:  Good peripheral perfusion.  Chest wall tenderness to palpation just to the left of the sternum, no redness or rash Resp:  Normal effort.  Clear to auscultation bilaterally Abd:  No distention.  Other:  No calf pain or swelling   ED Results / Procedures / Treatments   Labs (all labs ordered are listed, but only abnormal results are displayed) Labs Reviewed  BASIC METABOLIC PANEL WITH GFR - Abnormal; Notable for the following components:      Result Value   Glucose, Bld 101 (*)    All other components within normal limits  CBC -  Abnormal; Notable for the following components:   WBC 11.4 (*)    Hemoglobin 17.4 (*)    All other components within normal limits  TROPONIN I (HIGH SENSITIVITY)  TROPONIN I (HIGH SENSITIVITY)     EKG  ED ECG REPORT I, Lamar Price, the attending physician, personally viewed and interpreted this ECG.  Date: 01/05/2024  Rhythm: normal sinus rhythm QRS Axis: normal Intervals: normal ST/T Wave abnormalities: normal Narrative Interpretation: no evidence of acute ischemia    RADIOLOGY Chest x-ray view interpret by me, no acute abnormality, confirmed by radiology    PROCEDURES:  Critical Care performed:   Procedures   MEDICATIONS ORDERED IN ED: Medications - No data to display   IMPRESSION / MDM / ASSESSMENT AND PLAN / ED COURSE  I reviewed the triage vital signs and the nursing notes. Patient's presentation is most consistent with exacerbation of chronic  illness.  Patient presents with chest pain as detailed above, ongoing intermittent chronic issue, has tenderness to palpation of the chest wall suspicious for costochondritis.  Doubt ACS, doubt pneumonia, doubt pneumothorax  Lab work is reassuring, normal high sensitive troponin, normal EKG.  Chest x-ray unremarkable  Lab work demonstrates mild elevation of white blood cell count which is nonspecific otherwise reassuring.  Will refer to cardiology given the chronicity of this issue, in the meantime we will start him on NSAIDs for likely costochondritis        FINAL CLINICAL IMPRESSION(S) / ED DIAGNOSES   Final diagnoses:  Chest wall pain     Rx / DC Orders   ED Discharge Orders          Ordered    Ambulatory referral to Cardiology       Comments: If you have not heard from the Cardiology office within the next 72 hours please call [phone].   01/05/24 1240    naproxen  (NAPROSYN ) 500 MG tablet  2 times daily with meals        01/05/24 1241             Note:  This document was prepared  using Dragon voice recognition software and may include unintentional dictation errors.   Arlander Charleston, MD 01/05/24 1325

## 2024-04-11 ENCOUNTER — Other Ambulatory Visit: Payer: Self-pay

## 2024-04-11 ENCOUNTER — Emergency Department
Admission: EM | Admit: 2024-04-11 | Discharge: 2024-04-11 | Disposition: A | Discharge status: Home / Self Care | Source: Ambulatory Visit | Attending: Emergency Medicine | Admitting: Emergency Medicine

## 2024-04-11 ENCOUNTER — Emergency Department

## 2024-04-11 DIAGNOSIS — L03311 Cellulitis of abdominal wall: Secondary | ICD-10-CM | POA: Diagnosis not present

## 2024-04-11 DIAGNOSIS — K5900 Constipation, unspecified: Secondary | ICD-10-CM | POA: Diagnosis not present

## 2024-04-11 DIAGNOSIS — R1030 Lower abdominal pain, unspecified: Secondary | ICD-10-CM | POA: Diagnosis present

## 2024-04-11 LAB — COMPREHENSIVE METABOLIC PANEL WITH GFR
ALT: 60 U/L — ABNORMAL HIGH (ref 0–44)
AST: 39 U/L (ref 15–41)
Albumin: 4.6 g/dL (ref 3.5–5.0)
Alkaline Phosphatase: 99 U/L (ref 38–126)
Anion gap: 15 (ref 5–15)
BUN: 7 mg/dL (ref 6–20)
CO2: 21 mmol/L — ABNORMAL LOW (ref 22–32)
Calcium: 9.7 mg/dL (ref 8.9–10.3)
Chloride: 102 mmol/L (ref 98–111)
Creatinine, Ser: 0.86 mg/dL (ref 0.61–1.24)
GFR, Estimated: >60 mL/min (ref >60)
Glucose, Bld: 80 mg/dL (ref 70–99)
Potassium: 3.4 mmol/L — ABNORMAL LOW (ref 3.5–5.1)
Sodium: 139 mmol/L (ref 135–145)
Total Bilirubin: 0.6 mg/dL (ref 0.0–1.2)
Total Protein: 8 g/dL (ref 6.5–8.1)

## 2024-04-11 LAB — URINALYSIS, ROUTINE W REFLEX MICROSCOPIC
Bilirubin Urine: NEGATIVE
Glucose, UA: NEGATIVE mg/dL
Hgb urine dipstick: NEGATIVE
Ketones, ur: NEGATIVE mg/dL
Leukocytes,Ua: NEGATIVE
Nitrite: NEGATIVE
Protein, ur: NEGATIVE mg/dL
Specific Gravity, Urine: 1.004 — ABNORMAL LOW (ref 1.005–1.030)
pH: 6 (ref 5.0–8.0)

## 2024-04-11 LAB — CBC
HCT: 51.5 % (ref 39.0–52.0)
Hemoglobin: 18.1 g/dL — ABNORMAL HIGH (ref 13.0–17.0)
MCH: 32.3 pg (ref 26.0–34.0)
MCHC: 35.1 g/dL (ref 30.0–36.0)
MCV: 92 fL (ref 80.0–100.0)
Platelets: 369 K/uL (ref 150–400)
RBC: 5.6 MIL/uL (ref 4.22–5.81)
RDW: 13.5 % (ref 11.5–15.5)
WBC: 9.8 K/uL (ref 4.0–10.5)
nRBC: 0 % (ref 0.0–0.2)

## 2024-04-11 LAB — LIPASE, BLOOD: Lipase: 35 U/L (ref 11–51)

## 2024-04-11 MED ORDER — CEPHALEXIN 500 MG PO CAPS: 500.0000 mg | ORAL_CAPSULE | Freq: Three times a day (TID) | ORAL | 0 refills | Status: AC

## 2024-04-11 MED ORDER — POLYETHYLENE GLYCOL 3350 17 GM/SCOOP PO POWD: ORAL | 0 refills | Status: AC

## 2024-04-11 NOTE — ED Provider Notes (Signed)
 [facility] Provider Note    Event Date/Time   First MD Initiated Contact with Patient 04/11/24 2014     (approximate)   History   Chief Complaint: Abdominal Pain   HPI  MARQ REBELLO is a 33 y.o. male with no significant past medical history who comes ED complaining of lower abdominal wall paresthesia over the past week.  Also having constipation for the last 3 or 4 days.  No nausea or vomiting.  Normal oral intake.  No fever or chills.  No dysuria frequency urgency, no urinary retention or incontinence.  Denies any back pain or history of back injury.  No motor weakness in the legs.        History reviewed. No pertinent past medical history.  Current Outpatient Rx   Order #: [id number] Class: Normal   Order #: [id number] Class: Normal   Order #: [id number] Class: Normal   Order #: [id number] Class: Normal   Order #: [id number] Class: Normal   Order #: [id number] Class: Normal   Order #: [id number] Class: Normal    Past Surgical History:  Procedure Laterality Date   CHOLECYSTECTOMY      Physical Exam   Triage Vital Signs: ED Triage Vitals  Encounter Vitals Group     BP 04/11/24 1931 (!) 161/112     Girls Systolic BP Percentile --      Girls Diastolic BP Percentile --      Boys Systolic BP Percentile --      Boys Diastolic BP Percentile --      Pulse Rate 04/11/24 1931 74     Resp 04/11/24 1931 18     Temp 04/11/24 1931 97.9 F (36.6 C)     Temp src --      SpO2 04/11/24 1931 99 %     Weight 04/11/24 1931 253 lb (114.8 kg)     Height 04/11/24 1931 5' 9 (1.753 m)     Head Circumference --      Peak Flow --      Pain Score 04/11/24 1930 0     Pain Loc --      Pain Education --      Exclude from Growth Chart --     Most recent vital signs: Vitals:   04/11/24 1931  BP: (!) 161/112  Pulse: 74  Resp: 18  Temp: 97.9 F (36.6 C)  SpO2: 99%    General: Awake, no distress.  CV:  Good peripheral perfusion.  Regular rhythm.  Normal distal  pulses Resp:  Normal effort.  Abd:  No distention.  Soft nontender.  Altered sensation in the lower abdominal wall.  No rash or obvious inflammatory changes.  No wounds. Other:  Symmetric calf circumference.  Normal lower extremity strength, EHL intact, normal patellar reflexes   ED Results / Procedures / Treatments   Labs (all labs ordered are listed, but only abnormal results are displayed) Labs Reviewed  COMPREHENSIVE METABOLIC PANEL WITH GFR - Abnormal; Notable for the following components:      Result Value   Potassium 3.4 (*)    CO2 21 (*)    ALT 60 (*)    All other components within normal limits  CBC - Abnormal; Notable for the following components:   Hemoglobin 18.1 (*)    All other components within normal limits  URINALYSIS, ROUTINE W REFLEX MICROSCOPIC - Abnormal; Notable for the following components:   Color, Urine YELLOW (*)    APPearance CLEAR (*)    Specific Gravity, Urine 1.004 (*)  All other components within normal limits  LIPASE, BLOOD     EKG    RADIOLOGY CT abdomen pelvis interpreted by me, negative for bowel obstruction.  Radiology report reviewed noting subtle signs of infraumbilical cellulitis in the abdominal wall   PROCEDURES:  Procedures   MEDICATIONS ORDERED IN ED: Medications - No data to display   IMPRESSION / MDM / ASSESSMENT AND PLAN / ED COURSE  I reviewed the triage vital signs and the nursing notes.  DDx: Ventral hernia, meralgia paresthetica, UTI, electrolyte derangement, constipation, kidney stone  Patient's presentation is most consistent with acute presentation with potential threat to life or bodily function.  Patient presents with lower abdominal altered sensation.  Not consistent with stroke, shingles, spinal injury or cauda equina syndrome.  Labs are normal.  CT shows subtle area of abdominal wall cellulitis corresponding to his symptoms.  Will treat with Keflex .  MiraLAX  for constipation.       FINAL CLINICAL  IMPRESSION(S) / ED DIAGNOSES   Final diagnoses:  Abdominal wall cellulitis  Constipation, unspecified constipation type     Rx / DC Orders   ED Discharge Orders          Ordered    cephALEXin  (KEFLEX ) 500 MG capsule  3 times daily        04/11/24 2146    polyethylene glycol powder (GLYCOLAX /MIRALAX ) 17 GM/SCOOP powder        04/11/24 2146             Note:  This document was prepared using Dragon voice recognition software and may include unintentional dictation errors.   Viviann Pastor, MD 04/11/24 2150

## 2024-04-11 NOTE — ED Triage Notes (Signed)
 Pt reports aprox 1 week ago, he developed abd pain. Pt reports this developed into numbness in his lower abd and groin. Pt reports dec urine output, denies dysuria. Pt was seen by PCP earlier referred here for more workup. Pt denies pain at this time. Pt reports he has also had dec in BM.

## 2024-04-21 ENCOUNTER — Emergency Department
Admission: EM | Admit: 2024-04-21 | Discharge: 2024-04-21 | Disposition: A | Discharge status: Home / Self Care | Attending: Emergency Medicine | Admitting: Emergency Medicine

## 2024-04-21 ENCOUNTER — Other Ambulatory Visit: Payer: Self-pay

## 2024-04-21 ENCOUNTER — Emergency Department

## 2024-04-21 DIAGNOSIS — R202 Paresthesia of skin: Secondary | ICD-10-CM | POA: Diagnosis present

## 2024-04-21 DIAGNOSIS — R109 Unspecified abdominal pain: Secondary | ICD-10-CM | POA: Diagnosis not present

## 2024-04-21 DIAGNOSIS — N50812 Left testicular pain: Secondary | ICD-10-CM | POA: Insufficient documentation

## 2024-04-21 LAB — CBC
HCT: 52.8 % — ABNORMAL HIGH (ref 39.0–52.0)
Hemoglobin: 17.3 g/dL — ABNORMAL HIGH (ref 13.0–17.0)
MCH: 32 pg (ref 26.0–34.0)
MCHC: 32.8 g/dL (ref 30.0–36.0)
MCV: 97.6 fL (ref 80.0–100.0)
Platelets: 256 K/uL (ref 150–400)
RBC: 5.41 MIL/uL (ref 4.22–5.81)
RDW: 13.7 % (ref 11.5–15.5)
WBC: 11.2 K/uL — ABNORMAL HIGH (ref 4.0–10.5)
nRBC: 0 % (ref 0.0–0.2)

## 2024-04-21 LAB — URINALYSIS, ROUTINE W REFLEX MICROSCOPIC
Bilirubin Urine: NEGATIVE
Glucose, UA: NEGATIVE mg/dL
Hgb urine dipstick: NEGATIVE
Ketones, ur: NEGATIVE mg/dL
Leukocytes,Ua: NEGATIVE
Nitrite: NEGATIVE
Protein, ur: NEGATIVE mg/dL
Specific Gravity, Urine: 1.026 (ref 1.005–1.030)
pH: 5 (ref 5.0–8.0)

## 2024-04-21 LAB — COMPREHENSIVE METABOLIC PANEL WITH GFR
ALT: 61 U/L — ABNORMAL HIGH (ref 0–44)
AST: 29 U/L (ref 15–41)
Albumin: 4.4 g/dL (ref 3.5–5.0)
Alkaline Phosphatase: 98 U/L (ref 38–126)
Anion gap: 13 (ref 5–15)
BUN: 7 mg/dL (ref 6–20)
CO2: 24 mmol/L (ref 22–32)
Calcium: 9.5 mg/dL (ref 8.9–10.3)
Chloride: 103 mmol/L (ref 98–111)
Creatinine, Ser: 0.83 mg/dL (ref 0.61–1.24)
GFR, Estimated: >60 mL/min
Glucose, Bld: 105 mg/dL — ABNORMAL HIGH (ref 70–99)
Potassium: 4 mmol/L (ref 3.5–5.1)
Sodium: 140 mmol/L (ref 135–145)
Total Bilirubin: 0.7 mg/dL (ref 0.0–1.2)
Total Protein: 7.6 g/dL (ref 6.5–8.1)

## 2024-04-21 LAB — LIPASE, BLOOD: Lipase: 28 U/L (ref 11–51)

## 2024-04-21 NOTE — ED Provider Notes (Signed)
 "  [facility] Provider Note    Event Date/Time   First MD Initiated Contact with Patient 04/21/24 2135     (approximate)   History   Chief Complaint: Abdominal Pain, Flank Pain, and Testicle Pain   HPI  Eric Copeland is a 33 y.o. male with no significant past medical history who comes to the ED complaining of pain in his left testicle along with numbness in the scrotum and areas of paresthesia along the bilateral medial legs.  No back pain or injury.  No fever or chills or chest pain.  No vision changes or motor weakness.  No change in balance or coordination.  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  No aggravating or alleviating factors.  Patient was previously seen in the ED by me for similar symptoms, reassuring exam.  At that time, there was suspicion that he may have had a mild abdominal wall cellulitis, he took full course of Keflex  as prescribed without improvement.  In the interim, he was also seen at [facility] emergency department for similar symptoms, had workup for STI which was negative.        No past medical history on file.  Current Outpatient Rx   Order #: [id number] Class: Normal   Order #: [id number] Class: Normal   Order #: [id number] Class: Normal   Order #: [id number] Class: Normal   Order #: [id number] Class: Normal   Order #: [id number] Class: Normal   Order #: [id number] Class: Normal    Past Surgical History:  Procedure Laterality Date   CHOLECYSTECTOMY      Physical Exam   Triage Vital Signs: ED Triage Vitals  Encounter Vitals Group     BP 04/21/24 1715 133/86     Girls Systolic BP Percentile --      Girls Diastolic BP Percentile --      Boys Systolic BP Percentile --      Boys Diastolic BP Percentile --      Pulse Rate 04/21/24 1715 90     Resp 04/21/24 1715 18     Temp 04/21/24 1715 98.3 F (36.8 C)     Temp Source 04/21/24 1715 Oral     SpO2 04/21/24 1715 97 %     Weight 04/21/24 1716 250 lb (113.4 kg)      Height 04/21/24 1716 5' 9 (1.753 m)     Head Circumference --      Peak Flow --      Pain Score 04/21/24 1721 10     Pain Loc --      Pain Education --      Exclude from Growth Chart --     Most recent vital signs: Vitals:   04/21/24 1715 04/21/24 2021  BP: 133/86 (!) 133/95  Pulse: 90 82  Resp: 18 18  Temp: 98.3 F (36.8 C) (!) 97.5 F (36.4 C)  SpO2: 97% 100%    General: Awake, no distress. Obese.  Ambulatory CV:  Good peripheral perfusion.  Regular rate rhythm Resp:  Normal effort.  Abd:  No distention.  Soft nontender.  No hernia Other:  Symmetric calf circumference, no focal tenderness.  Neuro intact other than subjective areas of altered sensation.   ED Results / Procedures / Treatments   Labs (all labs ordered are listed, but only abnormal results are displayed) Labs Reviewed  COMPREHENSIVE METABOLIC PANEL WITH GFR - Abnormal; Notable for the following components:      Result Value   Glucose, Bld  105 (*)    ALT 61 (*)    All other components within normal limits  CBC - Abnormal; Notable for the following components:   WBC 11.2 (*)    Hemoglobin 17.3 (*)    HCT 52.8 (*)    All other components within normal limits  URINALYSIS, ROUTINE W REFLEX MICROSCOPIC - Abnormal; Notable for the following components:   Color, Urine YELLOW (*)    APPearance CLEAR (*)    All other components within normal limits  LIPASE, BLOOD     EKG    RADIOLOGY Ultrasound scrotum interpreted by me, negative for mass or fluid collection.  Radiology report reviewed   PROCEDURES:  Procedures   MEDICATIONS ORDERED IN ED: Medications - No data to display   IMPRESSION / MDM / ASSESSMENT AND PLAN / ED COURSE  I reviewed the triage vital signs and the nursing notes.  DDx: Varicocele, hydrocele, less likely testicular torsion, superficial nerve compression, electrolyte derangement, multiple sclerosis, anxiety  Patient's presentation is most consistent with acute  presentation with potential threat to life or bodily function.  Patient presents for the third time to the emergency department with unusual constellation of symptoms predominantly scattered areas of paresthesia.  No pain or trauma to the spine.  No fever.  No IVDU diabetes or other cause of immunosuppression.  He is nontoxic.  I will obtain MRI, if negative I think patient will need evaluation by neurology.  He understands and agrees with this plan.      FINAL CLINICAL IMPRESSION(S) / ED DIAGNOSES   Final diagnoses:  Paresthesias     Rx / DC Orders   ED Discharge Orders     None        Note:  This document was prepared using Dragon voice recognition software and may include unintentional dictation errors.   Viviann Pastor, MD 04/21/24 2242  "

## 2024-04-21 NOTE — Discharge Instructions (Addendum)
 Your MRI today is normal.  Please make an appointment with neurology for further evaluation of the symptoms.

## 2024-04-21 NOTE — ED Triage Notes (Signed)
 Pt states that he has been having episodes of leg tingling and numbness and into his feet and right calf, pt reports left side pain and sensation that his left testicle is pulling up inside him, hx of kidney stones years ago, bilat peal pulses palpated and marked in triage
